# Patient Record
Sex: Female | Born: 1960 | Race: White | Hispanic: No | Marital: Married | Smoking: Never smoker
Health system: Southern US, Community
[De-identification: ages and names within clinical notes are randomized; demographics above are authoritative.]

## PROBLEM LIST (undated history)

## (undated) DIAGNOSIS — G620 Drug-induced polyneuropathy: Secondary | ICD-10-CM

## (undated) DIAGNOSIS — R112 Nausea with vomiting, unspecified: Secondary | ICD-10-CM

## (undated) DIAGNOSIS — C801 Malignant (primary) neoplasm, unspecified: Secondary | ICD-10-CM

## (undated) DIAGNOSIS — N200 Calculus of kidney: Secondary | ICD-10-CM

## (undated) DIAGNOSIS — Z9221 Personal history of antineoplastic chemotherapy: Secondary | ICD-10-CM

## (undated) DIAGNOSIS — Z9013 Acquired absence of bilateral breasts and nipples: Secondary | ICD-10-CM

## (undated) DIAGNOSIS — Z95828 Presence of other vascular implants and grafts: Secondary | ICD-10-CM

## (undated) DIAGNOSIS — Z9889 Other specified postprocedural states: Secondary | ICD-10-CM

## (undated) DIAGNOSIS — T451X5A Adverse effect of antineoplastic and immunosuppressive drugs, initial encounter: Secondary | ICD-10-CM

## (undated) DIAGNOSIS — E0590 Thyrotoxicosis, unspecified without thyrotoxic crisis or storm: Secondary | ICD-10-CM

## (undated) DIAGNOSIS — Z923 Personal history of irradiation: Secondary | ICD-10-CM

## (undated) DIAGNOSIS — Z8489 Family history of other specified conditions: Secondary | ICD-10-CM

## (undated) DIAGNOSIS — C50912 Malignant neoplasm of unspecified site of left female breast: Secondary | ICD-10-CM

## (undated) HISTORY — DX: Malignant (primary) neoplasm, unspecified: C80.1

## (undated) HISTORY — DX: Presence of other vascular implants and grafts: Z95.828

## (undated) HISTORY — PX: DOPPLER ECHOCARDIOGRAPHY: SHX263

---

## 2000-03-25 ENCOUNTER — Emergency Department (HOSPITAL_COMMUNITY): Admission: EM | Admit: 2000-03-25 | Discharge: 2000-03-25 | Payer: Self-pay | Admitting: Emergency Medicine

## 2000-03-25 ENCOUNTER — Encounter: Payer: Self-pay | Admitting: Emergency Medicine

## 2000-10-19 ENCOUNTER — Other Ambulatory Visit: Admission: RE | Admit: 2000-10-19 | Discharge: 2000-10-19 | Payer: Self-pay | Admitting: Obstetrics and Gynecology

## 2001-02-10 ENCOUNTER — Inpatient Hospital Stay (HOSPITAL_COMMUNITY): Admission: RE | Admit: 2001-02-10 | Discharge: 2001-02-12 | Payer: Self-pay | Admitting: Obstetrics and Gynecology

## 2002-09-23 ENCOUNTER — Inpatient Hospital Stay (HOSPITAL_COMMUNITY): Admission: RE | Admit: 2002-09-23 | Discharge: 2002-09-24 | Payer: Self-pay | Admitting: Obstetrics and Gynecology

## 2002-09-23 ENCOUNTER — Encounter: Payer: Self-pay | Admitting: Obstetrics and Gynecology

## 2002-09-27 ENCOUNTER — Ambulatory Visit (HOSPITAL_COMMUNITY): Admission: AD | Admit: 2002-09-27 | Discharge: 2002-09-27 | Payer: Self-pay | Admitting: Obstetrics and Gynecology

## 2002-09-28 ENCOUNTER — Ambulatory Visit (HOSPITAL_COMMUNITY): Admission: AD | Admit: 2002-09-28 | Discharge: 2002-09-28 | Payer: Self-pay | Admitting: Obstetrics and Gynecology

## 2002-10-01 ENCOUNTER — Ambulatory Visit (HOSPITAL_COMMUNITY): Admission: AD | Admit: 2002-10-01 | Discharge: 2002-10-01 | Payer: Self-pay | Admitting: Obstetrics and Gynecology

## 2002-10-03 ENCOUNTER — Inpatient Hospital Stay (HOSPITAL_COMMUNITY): Admission: AD | Admit: 2002-10-03 | Discharge: 2002-10-06 | Payer: Self-pay | Admitting: Obstetrics and Gynecology

## 2011-10-03 DIAGNOSIS — C50912 Malignant neoplasm of unspecified site of left female breast: Secondary | ICD-10-CM

## 2011-10-03 HISTORY — DX: Malignant neoplasm of unspecified site of left female breast: C50.912

## 2011-10-04 ENCOUNTER — Ambulatory Visit (INDEPENDENT_AMBULATORY_CARE_PROVIDER_SITE_OTHER): Payer: Medicaid Other | Admitting: *Deleted

## 2011-10-04 ENCOUNTER — Encounter: Payer: Self-pay | Admitting: *Deleted

## 2011-10-04 ENCOUNTER — Other Ambulatory Visit: Payer: Self-pay | Admitting: Obstetrics and Gynecology

## 2011-10-04 VITALS — BP 88/51 | HR 71 | Temp 97.9°F | Ht 67.0 in | Wt 152.5 lb

## 2011-10-04 DIAGNOSIS — N6320 Unspecified lump in the left breast, unspecified quadrant: Secondary | ICD-10-CM

## 2011-10-04 DIAGNOSIS — Z01419 Encounter for gynecological examination (general) (routine) without abnormal findings: Secondary | ICD-10-CM

## 2011-10-04 DIAGNOSIS — N63 Unspecified lump in unspecified breast: Secondary | ICD-10-CM

## 2011-10-04 NOTE — Patient Instructions (Signed)
Taught patient how to perform BSE. Let her know BCCCP will cover Pap smears every 3 years unless has a history of abnormal Pap smear. Patient referred to the [facility] for diagnostic mammogram and possible left breast ultrasound. Appointment scheduled for Friday, Oct 07, 2011 at 0900. Patient aware of appointment and will be there. Let patient know will follow up with her within the next couple weeks with results. Patient verbalized understanding.

## 2011-10-04 NOTE — Progress Notes (Addendum)
Patient complained of left breast pain, left breast lump, left breast nipple discharge and left breast nipple inversion  Pap Smear:    Pap smear completed today. Per patient last Pap smear was 10/03/02 and normal. Per patient no history of abnormal Pap smears. No Pap smear results in EPIC.  Physical exam: Breasts Breasts symmetrical. No skin abnormalities bilateral breasts. No nipple retraction right breast. Slight nipple retraction in left breast per patient is not normal for her and has been a recent change. No nipple discharge right breast. Per patient has spontaneous brownish colored left breast nipple discharge that has been occuring at times since 2007. No nipple discharge on exam. No lymphadenopathy. No lumps palpated right breast. Palpated lump in left breast at 1 o'clock next to areola that per patient has increased in size since 2007 when she first noticed. Patient complained of moderate pain that comes and goes in left breast that has occurred since 2007 and has been increasing. No complaints of pain or tenderness on exam. Patient referred to the [facility] for diagnostic mammogram and possible left breast ultrasound. Appointment scheduled for Friday, Oct 07, 2011 at 0900.         Pelvic/Bimanual   Ext Genitalia No lesions, no swelling and no discharge observed on external genitalia.         Vagina Vagina pink and normal texture. No lesions or discharge observed in vagina.          Cervix Cervix is present. Cervix pink and of normal texture. No discharge observed.     Uterus Uterus is present and palpable. Uterus in normal position and normal size.        Adnexae Bilateral ovaries present and palpable. No tenderness on palpation.          Rectovaginal No rectal exam completed today since patient had no rectal complaints. No skin abnormalities observed on exam.

## 2011-10-07 ENCOUNTER — Other Ambulatory Visit: Payer: Self-pay | Admitting: Obstetrics and Gynecology

## 2011-10-07 ENCOUNTER — Ambulatory Visit
Admission: RE | Admit: 2011-10-07 | Discharge: 2011-10-07 | Disposition: A | Discharge status: Home / Self Care | Payer: No Typology Code available for payment source | Source: Ambulatory Visit | Attending: Obstetrics and Gynecology | Admitting: Obstetrics and Gynecology

## 2011-10-07 DIAGNOSIS — N6320 Unspecified lump in the left breast, unspecified quadrant: Secondary | ICD-10-CM

## 2011-10-07 DIAGNOSIS — R921 Mammographic calcification found on diagnostic imaging of breast: Secondary | ICD-10-CM

## 2011-10-12 ENCOUNTER — Encounter: Payer: Self-pay | Admitting: Obstetrics and Gynecology

## 2011-10-13 ENCOUNTER — Other Ambulatory Visit: Payer: Self-pay | Admitting: Obstetrics and Gynecology

## 2011-10-13 ENCOUNTER — Ambulatory Visit
Admission: RE | Admit: 2011-10-13 | Discharge: 2011-10-13 | Disposition: A | Discharge status: Home / Self Care | Payer: Self-pay | Source: Ambulatory Visit | Attending: Obstetrics and Gynecology | Admitting: Obstetrics and Gynecology

## 2011-10-13 DIAGNOSIS — N6320 Unspecified lump in the left breast, unspecified quadrant: Secondary | ICD-10-CM

## 2011-10-13 DIAGNOSIS — R921 Mammographic calcification found on diagnostic imaging of breast: Secondary | ICD-10-CM

## 2011-10-14 ENCOUNTER — Other Ambulatory Visit: Payer: Self-pay | Admitting: Obstetrics and Gynecology

## 2011-10-14 ENCOUNTER — Ambulatory Visit
Admission: RE | Admit: 2011-10-14 | Discharge: 2011-10-14 | Disposition: A | Discharge status: Home / Self Care | Payer: Self-pay | Source: Ambulatory Visit | Attending: Obstetrics and Gynecology | Admitting: Obstetrics and Gynecology

## 2011-10-14 DIAGNOSIS — N6320 Unspecified lump in the left breast, unspecified quadrant: Secondary | ICD-10-CM

## 2011-10-14 DIAGNOSIS — C50912 Malignant neoplasm of unspecified site of left female breast: Secondary | ICD-10-CM

## 2011-10-19 ENCOUNTER — Telehealth: Payer: Self-pay | Admitting: *Deleted

## 2011-10-19 NOTE — Telephone Encounter (Signed)
Attempted to call patient to set up a time to complete her BCCCP Medicaid paperwork. No one answered phone. Left message for patient to call me back.

## 2011-10-21 ENCOUNTER — Ambulatory Visit
Admission: RE | Admit: 2011-10-21 | Discharge: 2011-10-21 | Disposition: A | Discharge status: Home / Self Care | Payer: Self-pay | Source: Ambulatory Visit | Attending: Obstetrics and Gynecology | Admitting: Obstetrics and Gynecology

## 2011-10-21 ENCOUNTER — Other Ambulatory Visit: Payer: Self-pay

## 2011-10-21 DIAGNOSIS — C50912 Malignant neoplasm of unspecified site of left female breast: Secondary | ICD-10-CM

## 2011-10-21 MED ORDER — GADOBENATE DIMEGLUMINE 529 MG/ML IV SOLN
13.0000 mL | Freq: Once | INTRAVENOUS | Status: AC | PRN
  Administered 2011-10-21: 13 mL via INTRAVENOUS

## 2011-10-25 ENCOUNTER — Other Ambulatory Visit: Payer: Self-pay | Admitting: Obstetrics and Gynecology

## 2011-10-25 DIAGNOSIS — R928 Other abnormal and inconclusive findings on diagnostic imaging of breast: Secondary | ICD-10-CM

## 2011-10-26 ENCOUNTER — Telehealth: Payer: Self-pay | Admitting: *Deleted

## 2011-10-26 ENCOUNTER — Encounter: Payer: Self-pay | Admitting: *Deleted

## 2011-10-26 ENCOUNTER — Ambulatory Visit (INDEPENDENT_AMBULATORY_CARE_PROVIDER_SITE_OTHER): Payer: Medicaid Other | Admitting: General Surgery

## 2011-10-26 ENCOUNTER — Encounter (INDEPENDENT_AMBULATORY_CARE_PROVIDER_SITE_OTHER): Payer: Self-pay | Admitting: General Surgery

## 2011-10-26 ENCOUNTER — Ambulatory Visit
Admission: RE | Admit: 2011-10-26 | Discharge: 2011-10-26 | Disposition: A | Discharge status: Home / Self Care | Payer: No Typology Code available for payment source | Source: Ambulatory Visit | Attending: Obstetrics and Gynecology | Admitting: Obstetrics and Gynecology

## 2011-10-26 VITALS — BP 114/72 | HR 70 | Temp 96.9°F | Resp 16 | Ht 67.0 in | Wt 155.0 lb

## 2011-10-26 DIAGNOSIS — R928 Other abnormal and inconclusive findings on diagnostic imaging of breast: Secondary | ICD-10-CM

## 2011-10-26 DIAGNOSIS — C50919 Malignant neoplasm of unspecified site of unspecified female breast: Secondary | ICD-10-CM

## 2011-10-26 NOTE — Progress Notes (Signed)
Called Cathy at [facility] and gave her Dr. Renelda Loma appt info.  She will give to pt.  Mailed before letter & packet to pt.  Emailed Bernie at [facility] to make her aware.

## 2011-10-26 NOTE — Telephone Encounter (Signed)
Attempted to call patient to complete BCCCP Medicaid paperwork. No one answered phone. Left message for patient to call back. Will send patient certified letter since second attempt to call patient and have not been able to reach patient.

## 2011-10-26 NOTE — Patient Instructions (Signed)
We will arrange for you to have an appointment with the medical oncology specialist at [facility].  Please read the sections on mastectomy and lymph node biopsy.

## 2011-10-26 NOTE — Progress Notes (Signed)
Patient ID: Bethany Michael, female   DOB: 09/10/1960, 51 y.o.   MRN: [id number]  Chief Complaint  Patient presents with  . Breast Cancer    HPI Bethany Michael is a 51 y.o. female.   HPI  She is referred by Dr. Daniel Boyle at the [facility] [place] for evaluation of a newly diagnosed left breast cancer.  She noticed a small left breast mass at least 5 years ago and presented to her PCP because of that.  A plan of action was suggested but she did not go through with it because her husband was diagnosed with prostate cancer at the time.  The mass has been getting larger and show noticed some transient left arm swelling.  The arm swelling has resolved but it prompted her to seek medical attention for the mass.  She underwent a mammogram and US which demonstrated a 3.8 cm left breast mass.  There were also some areas of concern in the right breast.  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  Proliferation rate is 67%.  MRI demonstrated the left breast masses as well as worrisome areas in  The right breast.  She has an appointment at 1:40 pm today for repeat US and possible biopsy of the suspicious areas in the right breast.  There is no family history of breast cancer.  Age at menarche was 18.  Age at first childbirth was 38.  She is still having menstrual periods.  The mass is not painful.  Past Medical History  Diagnosis Date  . Cancer     breast    Past Surgical History  Procedure Date  . Cesarean section     3 previous    Family History  Problem Relation Age of Onset  . Diabetes Mother     Social History History  Substance Use Topics  . Smoking status: Never Smoker   . Smokeless tobacco: Never Used  . Alcohol Use: Yes     occassionally    No Known Allergies  No current outpatient prescriptions on file.    Review of Systems Review of Systems  Constitutional: Negative.   HENT: Negative.     Respiratory: Negative.   Cardiovascular: Negative.   Gastrointestinal: Negative.   Genitourinary: Negative.   Musculoskeletal: Negative.   Neurological: Negative.   Hematological: Negative.     Blood pressure 114/72, pulse 70, temperature 96.9 F (36.1 C), temperature source Temporal, resp. rate 16, height 5' 7" (1.702 m), weight 155 lb (70.308 kg), last menstrual period 09/21/2011.  Physical Exam Physical Exam  Constitutional: She appears well-developed and well-nourished. No distress.  HENT:  Head: Normocephalic and atraumatic.  Eyes: Pupils are equal, round, and reactive to light. No scleral icterus.  Neck: Normal range of motion.  Cardiovascular: Normal rate and regular rhythm.   Pulmonary/Chest: Effort normal and breath sounds normal.       4-5 cm left breast mass at the 1:00 position nipple-areolar complex with an overlying skin rash; right breast demonstrateds irregularity in the superior aspect of the breast , but no dominant mass  Abdominal: Soft. She exhibits no distension and no mass. There is no tenderness.  Musculoskeletal: Normal range of motion. She exhibits no edema.       No axillary or supraclavicular adenopathy.  The arm swelling.  Lymphadenopathy:    She has no cervical adenopathy.  Neurological: She is alert.  Skin: Skin is warm and   dry.  Psychiatric: She has a normal mood and affect. Her behavior is normal.    Data Reviewed Imaging studies, pathology report  Assessment    Invasive left breast cancer, triple negative.  Also, has suspicious lesions in the right breast.  She told me that she has done some research on breast cancer and wants to have bilateral mastectomies.    Plan    Keep appointment with Breast Center today.  Referral to Medical Oncology.  If after these appointments, she still wants to proceed with bilateral mastectomies rather than neoadjuvant chemotherapy, we will proceed with this as well as axillary sentinel lymph node biopsy.  I  have explained the procedure, risks, and aftercare to her.  Risks include but are not limited to bleeding, infection, wound problems, anesthesia, chronic chest wall pain, nerve injury, seroma formation, lymphedema.  She seems to understand all of this.       Kenyata Guess J 10/26/2011, 2:03 PM    

## 2011-10-27 ENCOUNTER — Other Ambulatory Visit: Payer: Self-pay | Admitting: Obstetrics and Gynecology

## 2011-10-27 DIAGNOSIS — R928 Other abnormal and inconclusive findings on diagnostic imaging of breast: Secondary | ICD-10-CM

## 2011-10-29 DIAGNOSIS — Z95828 Presence of other vascular implants and grafts: Secondary | ICD-10-CM

## 2011-10-29 HISTORY — DX: Presence of other vascular implants and grafts: Z95.828

## 2011-10-31 ENCOUNTER — Other Ambulatory Visit: Payer: Self-pay | Admitting: Oncology

## 2011-10-31 DIAGNOSIS — C50919 Malignant neoplasm of unspecified site of unspecified female breast: Secondary | ICD-10-CM

## 2011-11-01 ENCOUNTER — Encounter: Payer: Self-pay | Admitting: Oncology

## 2011-11-01 ENCOUNTER — Ambulatory Visit: Payer: No Typology Code available for payment source

## 2011-11-01 ENCOUNTER — Other Ambulatory Visit (HOSPITAL_BASED_OUTPATIENT_CLINIC_OR_DEPARTMENT_OTHER): Payer: No Typology Code available for payment source | Admitting: Lab

## 2011-11-01 ENCOUNTER — Other Ambulatory Visit (INDEPENDENT_AMBULATORY_CARE_PROVIDER_SITE_OTHER): Payer: Self-pay | Admitting: General Surgery

## 2011-11-01 ENCOUNTER — Ambulatory Visit (HOSPITAL_BASED_OUTPATIENT_CLINIC_OR_DEPARTMENT_OTHER): Payer: No Typology Code available for payment source | Admitting: Oncology

## 2011-11-01 ENCOUNTER — Telehealth: Payer: Self-pay | Admitting: *Deleted

## 2011-11-01 VITALS — BP 112/63 | HR 62 | Temp 98.3°F | Ht 67.0 in | Wt 156.9 lb

## 2011-11-01 DIAGNOSIS — C50919 Malignant neoplasm of unspecified site of unspecified female breast: Secondary | ICD-10-CM

## 2011-11-01 DIAGNOSIS — Z171 Estrogen receptor negative status [ER-]: Secondary | ICD-10-CM

## 2011-11-01 LAB — COMPREHENSIVE METABOLIC PANEL
AST: 12 U/L (ref 0–37)
Alkaline Phosphatase: 58 U/L (ref 39–117)
BUN: 15 mg/dL (ref 6–23)
Calcium: 9.2 mg/dL (ref 8.4–10.5)
Creatinine, Ser: 0.81 mg/dL (ref 0.50–1.10)

## 2011-11-01 LAB — CBC WITH DIFFERENTIAL/PLATELET
Basophils Absolute: 0.1 10*3/uL (ref 0.0–0.1)
Eosinophils Absolute: 0.1 10*3/uL (ref 0.0–0.5)
HGB: 13.2 g/dL (ref 11.6–15.9)
MCV: 94.8 fL (ref 79.5–101.0)
MONO#: 0.4 10*3/uL (ref 0.1–0.9)
MONO%: 9.3 % (ref 0.0–14.0)
NEUT#: 2.8 10*3/uL (ref 1.5–6.5)
Platelets: 197 10*3/uL (ref 145–400)
RDW: 13.9 % (ref 11.2–14.5)

## 2011-11-01 NOTE — Progress Notes (Signed)
Referral MD  Reason for Referral: Locally advanced breast cancer    No chief complaint on file. : Breast pain   HPI: This is a 51 year old from  [place] who presents with locally advanced breast cancer affecting the left breast. She initially noted the presence of this mass about 5 years ago. At that time her husband been diagnosed with prostate cancer and she essentially elected to ignore it Over the past few months she began to experience pain in her left breast and so she does decide to go ahead for have this investigated.  A mammogram performed 10/07/2011 showed a suspicious mass in the upper-outer quadrant of the left breast measuring about 4 cm. There is also some indeterminate calcifications in the right breast. A biopsy performed 10/13/2011 showed this to be a ER and PR negative breast cancer, proliferative index 50%, HER-2 was negative. An MRI was performed and results are detailed below. Findings: Intense background parenchymal enhancement is seen  bilaterally. An irregular enhancing mass with spiculated margins  is seen in the retroareolar region of the left breast spanning  zones A-C with associated nipple retraction and elevation of the  pectoralis muscle measuring 4.9 x 4.9 x 3.9 cm. Skin thickening and  enhancement is noted along the lateral aspect of the left breast  suspicious for dermal invasion. No other mass or suspicious  enhancement seen in the left breast. No axillary or internal  mammary adenopathy is present.  In the retroareolar region of the right breast, middle third, there  is an enhancing irregular mass with spiculated margins and  predominantly plateau kinetics measuring 1.7 x 1.4 x 1.7 cm which  is suspicious for malignancy. No other suspicious mass or  enhancement is seen in the right breast. There is no axillary or  internal mammary adenopathy.  IMPRESSION:  1. Known malignancy, left breast detailed above.  2. Suspicious mass,  retroareolar region of the right breast  detailed above for which second look ultrasound is recommended. If  this is not seen sonographically, an MRI guided biopsy is  recommended.  Past Medical History  Diagnosis Date  . Cancer     breast  :  Past Surgical History  Procedure Date  . Cesarean section     3 previous  :  No current outpatient prescriptions on file.:    :  No Known Allergies:  Family History  Problem Relation Age of Onset  . Diabetes Mother   :  History   Social History  . Marital Status:  married for 20 years.     Spouse Name: N/A    Number of Children:  3 ages 63 and 81   . Years of Education: N/A   Occupational History  . Not on file.works part-time at [employer], husband who has metastatic prostate cancer is on disability    Social History Main Topics  . Smoking status: Never Smoker   . Smokeless tobacco: Never Used  . Alcohol Use: Yes     occassionally  . Drug Use: No  . Sexually Active: Not Currently   Other Topics Concern  . Not on file   Social History Narrative  . No narrative on file  :    A comprehensive review of systems was negative.  Exam:   General appearance: alert, cooperative and appears stated age Eyes: conjunctivae/corneas clear. PERRL, EOM's intact. Fundi benign. Throat: lips, mucosa, and tongue normal; teeth and gums normal Resp: clear to auscultation bilaterally and normal percussion bilaterally  Breasts: Right breast essentially normal, there is some dense fibrocystic tissue. The left breast has a visible mass in the upper outer quadrant of the breast. Because of dense fibrous tissue is difficult to actually determine size of the mass of the upper outer quadrant has a rash over the surface of the breast which may be an allergic-type reaction versus skin involvement. GI: soft, non-tender; bowel sounds normal; no masses,  no organomegaly Extremities: extremities normal, atraumatic, no cyanosis or  edema Pulses: 2+ and symmetric Lymph nodes: Cervical, supraclavicular, and axillary nodes normal. Neurologic: Grossly normal   Basename 11/01/11 0924  WBC 4.7  HGB 13.2  HCT 39.6  PLT 197   No results found for this basename: NA:2,K:2,CL:2,CO2:2,GLUCOSE:2,BUN:2,CREATININE:2,CALCIUM:2 in the last 72 hours  Blood smear review: n/a  Pathology:as above  US Breast Right  10/26/2011  *RADIOLOGY REPORT*  Clinical Data:  51 year old female with newly diagnosed left breast carcinoma. Abnormal breast MRI demonstrating a 1.7 cm right retroareolar mass - for second look ultrasound.  RIGHT BREAST ULTRASOUND  Comparison:  10/07/2011 mammograms.  On physical exam, mild thickening throughout the right breast is noted.  Findings: Ultrasound is performed, showing extremely dense fibroglandular tissue throughout the right breast with scattered areas of shadowing.  A discrete mass is difficult to identify which would correspond to the MR finding.  IMPRESSION: Right breast MR abnormality/mass not identified sonographically. MR guided right breast biopsy is recommended.  This finding was discussed with the patient and her questions answered.  BI-RADS CATEGORY 1:  Negative.  Recommend MR guided biopsy of the right breast mass identified on recent MRI. Our office will schedule this appointment and contact the patient.  Original Report Authenticated By: Rosendo Gros, M.D.   US Breast Bilateral  10/07/2011  *RADIOLOGY REPORT*  Clinical Data:  Palpable lump in the left breast associated with nipple retraction.  Initial mammogram.  DIGITAL DIAGNOSTIC BILATERAL MAMMOGRAM WITH CAD AND BILATERAL BREAST ULTRASOUND:  Comparison:  None.  Findings:  CC and MLO views of both breasts and spot magnification views of the right breast were obtained.  Heterogeneously dense fibroglandular parenchymal pattern.  Approximate 4 cm spiculated mass with associated microcalcifications in the upper and slightly outer left breast, associated  with skin thickening and nipple retraction.  Microcalcifications scattered throughout the right breast, more clustered in the upper outer quadrant.  The spot magnification views of the upper outer right breast show that these calcifications are pleomorphic and in a linear/segmental orientation. Mammographic images were processed with CAD.  On physical exam, I palpate a firm, non-mobile mass in the 12 o'clock position of the right breast approximately 3-4 cm from the nipple, extending over an approximate 3-4 cm length.  Palpable firm tissue is present in the upper outer right breast, though a discrete mass was not palpated.  Ultrasound is performed, showing an irregular, spiculated, hypoechoic mass with associated microcalcifications and acoustic shadowing in the 12 o'clock position of the left breast, beginning approximately 3 cm from the nipple, with approximate measurements of 3.8 x 1.0 x 1.6 cm.  Evaluation of the left axilla revealed no pathologic lymphadenopathy.  No solid mass or abnormal acoustic shadowing was identified in the upper outer quadrant of the right breast; at the 11 o'clock position, 10 cm from the nipple, is an area of focal fibrocystic change.  IMPRESSION:  1.  Approximate 4 cm spiculated mass (3.8 cm by ultrasound) associated with microcalcifications in the upper and slightly outer left breast, consistent with an invasive cancer. 2.  Indeterminate but suspicious microcalcifications in the upper outer quadrant of the right breast.  Microcalcifications are present throughout the right breast.  Recommendation:  Ultrasound guided core needle biopsy of the left breast mass and stereotactic core needle biopsy of the calcifications in the upper outer quadrant of the right breast is recommended.  This was discussed with the patient who agrees to proceed.  The biopsies have been scheduled for May 16 at 8:30 a.m.  BI-RADS CATEGORY 5:  Highly suggestive of malignancy - appropriate action should be taken.   Original Report Authenticated By: Arnell Sieving, M.D.   [place] Core Biopsy  10/13/2011  **ADDENDUM** CREATED: 10/13/2011 10:33:14  Note that the a stereotactic right breast biopsy was attempted, however of the widespread scattered microcalcifications of the upper outer right breast were somewhat difficult to localize.  The group identified on patient's recent diagnostic exam were very close to the skin and technically to difficult to get to stereotactically. It was discussed with the patient that these microcalcifications would have to be biopsied via needle localization with surgical excision, although there was a good chance that these were benign in nature.  It was discussed that there was a high likelihood that the left breast biopsy would be positive for malignancy and that the patient would require a breast MRI.  If there was a suspicious area in the right upper quadrant on the breast MRI, we could pursue biopsy of these microcalcifications, however biopsy was deferred at this time.  **END ADDENDUM** SIGNED BY: Melton Alar. Micheline Maze, M.D.   10/13/2011  *RADIOLOGY REPORT*  Clinical Data:  Patient presents for ultrasound-guided core biopsy of a 4 cm spiculated mass with microcalcifications over the 12 o'clock position of the left breast.  ULTRASOUND GUIDED CORE BIOPSY OF THE left BREAST  Comparison: 10/07/2011  I met with the patient and we discussed the procedure of ultrasound- guided biopsy, including benefits and alternatives.  We discussed the high likelihood of a successful procedure. We discussed the risks of the procedure, including infection, bleeding, tissue injury, clip migration, and inadequate sampling.  Informed written consent was given.  Using sterile technique 4 ml lidocaine, ultrasound guidance and a 14 gauge automated biopsy device, biopsy was performed of the targeted mass with microcalcifications at the 12 o'clock position. At the conclusion of the procedure a ribbon shaped tissue marker clip  was deployed into the biopsy cavity.  Follow up 2 view mammogram was performed and dictated separately.  IMPRESSION: Ultrasound guided biopsy of a spiculated 4 cm mass with microcalcifications at the 12 o'clock position of the left breast. No apparent complications. Original Report Authenticated By: Elba Barman, M.D.   Mm Digital Diagnostic Bilat  10/07/2011  *RADIOLOGY REPORT*  Clinical Data:  Palpable lump in the left breast associated with nipple retraction.  Initial mammogram.  DIGITAL DIAGNOSTIC BILATERAL MAMMOGRAM WITH CAD AND BILATERAL BREAST ULTRASOUND:  Comparison:  None.  Findings:  CC and MLO views of both breasts and spot magnification views of the right breast were obtained.  Heterogeneously dense fibroglandular parenchymal pattern.  Approximate 4 cm spiculated mass with associated microcalcifications in the upper and slightly outer left breast, associated with skin thickening and nipple retraction.  Microcalcifications scattered throughout the right breast, more clustered in the upper outer quadrant.  The spot magnification views of the upper outer right breast show that these calcifications are pleomorphic and in a linear/segmental orientation. Mammographic images were processed with CAD.  On physical exam, I palpate a firm, non-mobile mass in  the 12 o'clock position of the right breast approximately 3-4 cm from the nipple, extending over an approximate 3-4 cm length.  Palpable firm tissue is present in the upper outer right breast, though a discrete mass was not palpated.  Ultrasound is performed, showing an irregular, spiculated, hypoechoic mass with associated microcalcifications and acoustic shadowing in the 12 o'clock position of the left breast, beginning approximately 3 cm from the nipple, with approximate measurements of 3.8 x 1.0 x 1.6 cm.  Evaluation of the left axilla revealed no pathologic lymphadenopathy.  No solid mass or abnormal acoustic shadowing was identified in the upper  outer quadrant of the right breast; at the 11 o'clock position, 10 cm from the nipple, is an area of focal fibrocystic change.  IMPRESSION:  1.  Approximate 4 cm spiculated mass (3.8 cm by ultrasound) associated with microcalcifications in the upper and slightly outer left breast, consistent with an invasive cancer. 2.  Indeterminate but suspicious microcalcifications in the upper outer quadrant of the right breast.  Microcalcifications are present throughout the right breast.  Recommendation:  Ultrasound guided core needle biopsy of the left breast mass and stereotactic core needle biopsy of the calcifications in the upper outer quadrant of the right breast is recommended.  This was discussed with the patient who agrees to proceed.  The biopsies have been scheduled for May 16 at 8:30 a.m.  BI-RADS CATEGORY 5:  Highly suggestive of malignancy - appropriate action should be taken.  Original Report Authenticated By: Arnell Sieving, M.D.   Mm Digital Diagnostic Unilat L  10/13/2011  *RADIOLOGY REPORT*  Clinical Data: The patient is post ultrasound guided core needle biopsy of a 4 cm spiculated mass with microcalcifications at the 12 o'clock position of the left breast.  ULTRASOUND GUIDED POST-BIOPSY CLIP PLACEMENT LEFT  Comparison:  10/07/2011  Findings: Exam demonstrates satisfactory placement of a ribbon shaped clip over patient's biopsied mass at the 12 o'clock position.  IMPRESSION: Satisfactory clip placement post ultrasound guided core biopsy left breast mass.  Original Report Authenticated By: Elba Barman, M.D.   Mm Radiologist Eval And Mgmt  10/14/2011  *RADIOLOGY REPORT*  ESTABLISHED PATIENT OFFICE VISIT - LEVEL II [id number])  Chief Complaint:  Post left breast biopsy  History:  Patient underwent ultrasound-guided core biopsy of an irregular 3.8 cm mass at the 12 o'clock position of the left breast.  Exam:  The biopsy site is clean and dry without signs of hematoma. There is minor skin irritation  adjacent the biopsy site as the patient states she had removed the Steri-Strips in the shower.  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  This is concordant with the imaging findings.  The patient's immediate questions and concerns were addressed.  The patient was given a follow-up surgical appointment with Dr. Abbey Chatters 10/26/2011 at 10:45 a.m. and an MRI appointment at [facility] 10/21/2011 at 10:15 a.m. The patient was given educational materials concerning breast cancer.  Will await results of patient's MRI to decide if there is a need for biopsy of right breast microcalcifications.  Original Report Authenticated By: Elba Barman, M.D.    Assessment and Plan:  51 year old woman with locally advanced breast cancer dating back at least 5 years. This is a triple negative breast cancer. With a fairly lengthy discussion regarding the management of this. She is concerned about the commitment to treatment given her other responsibilities both to her  children job and husband. I explained that even she had a mastectomy that she would likely need to have some form of adjuvant chemotherapy and likely radiation. I think that she would best be served at to be treated in [place]. I think a baseline MRI scan of the pectoralis involvement and as well as skin involvement. The locally advanced nature this cancer would require some form of downstaging chemotherapy to please make a mastectomy more feasible. She also had a biopsy of the contralateral breast.  Essentially she will need to have staging scans which can take place locally I will see her next week when I am in her local hospital and will make arrangements for her to be seen in followup by Dr. Mariel Sleet  in at [facility]. I would recommend that she receive dose dense FEC followed by dose dense Taxotere for total of 8 cycles, 4 of each.  Pierce Crane  M.D. FRCP C. 11/01/2011

## 2011-11-01 NOTE — Progress Notes (Signed)
Patient came in today as a new patient,she has been seen at the [facility] with Fonnie Mu.I gave her a Medicaid application and [place] application to fill out and return to me and then I will get with Wynona Canes on her medicaid application.

## 2011-11-01 NOTE — Telephone Encounter (Signed)
gave patient appointment for 11-08-2011 chemo edu class 11-08-2011 echo at [place] 11-09-2011 for pet scan arrival time 9:15am printed out calendar and gave to the patient called left voice to inform dr.rosenbower that dr.rubin would like to get the port a cath placed

## 2011-11-04 ENCOUNTER — Encounter (INDEPENDENT_AMBULATORY_CARE_PROVIDER_SITE_OTHER): Payer: Self-pay | Admitting: General Surgery

## 2011-11-04 NOTE — Progress Notes (Signed)
Patient ID: Bethany Michael, female   DOB: 1961-01-04, 51 y.o.   MRN: [id number] I was able to contact her yesterday and talk about the Port-A-Cath insertion which she is agreeable to. The plan is for neoadjuvant chemotherapy.  We will work on scheduling an operative date for her.  The procedure risks and aftercare been explained. Risks include but are not limited to bleeding, infection, malfunction, pneumothorax, wound problems, DVT.

## 2011-11-07 ENCOUNTER — Other Ambulatory Visit (HOSPITAL_COMMUNITY): Payer: Self-pay | Admitting: Oncology

## 2011-11-07 ENCOUNTER — Ambulatory Visit
Admission: RE | Admit: 2011-11-07 | Discharge: 2011-11-07 | Disposition: A | Discharge status: Home / Self Care | Payer: No Typology Code available for payment source | Source: Ambulatory Visit | Attending: Obstetrics and Gynecology | Admitting: Obstetrics and Gynecology

## 2011-11-07 ENCOUNTER — Telehealth: Payer: Self-pay | Admitting: *Deleted

## 2011-11-07 ENCOUNTER — Other Ambulatory Visit (HOSPITAL_COMMUNITY): Payer: Self-pay

## 2011-11-07 ENCOUNTER — Encounter (HOSPITAL_COMMUNITY): Payer: Self-pay

## 2011-11-07 ENCOUNTER — Encounter (HOSPITAL_COMMUNITY): Payer: Medicaid Other | Attending: Oncology

## 2011-11-07 VITALS — BP 107/66 | HR 60 | Temp 97.7°F | Wt 154.4 lb

## 2011-11-07 DIAGNOSIS — R928 Other abnormal and inconclusive findings on diagnostic imaging of breast: Secondary | ICD-10-CM

## 2011-11-07 DIAGNOSIS — C50919 Malignant neoplasm of unspecified site of unspecified female breast: Secondary | ICD-10-CM | POA: Insufficient documentation

## 2011-11-07 DIAGNOSIS — Z171 Estrogen receptor negative status [ER-]: Secondary | ICD-10-CM

## 2011-11-07 MED ORDER — GADOBENATE DIMEGLUMINE 529 MG/ML IV SOLN
13.0000 mL | Freq: Once | INTRAVENOUS | Status: AC | PRN
  Administered 2011-11-07: 13 mL via INTRAVENOUS

## 2011-11-07 MED ORDER — PROCHLORPERAZINE 25 MG RE SUPP: 25.0000 mg | Freq: Two times a day (BID) | RECTAL | Status: DC | PRN

## 2011-11-07 MED ORDER — LORAZEPAM 0.5 MG PO TABS: 0.5000 mg | ORAL_TABLET | Freq: Four times a day (QID) | ORAL | Status: DC | PRN

## 2011-11-07 MED ORDER — LIDOCAINE-PRILOCAINE 2.5-2.5 % EX CREA
TOPICAL_CREAM | Freq: Once | CUTANEOUS | Status: DC
  Filled 2011-11-07: qty 5

## 2011-11-07 MED ORDER — ONDANSETRON HCL 8 MG PO TABS: ORAL_TABLET | ORAL | Status: DC

## 2011-11-07 MED ORDER — DEXAMETHASONE 4 MG PO TABS: ORAL_TABLET | ORAL | Status: DC

## 2011-11-07 MED ORDER — PROCHLORPERAZINE MALEATE 10 MG PO TABS: 10.0000 mg | ORAL_TABLET | Freq: Four times a day (QID) | ORAL | Status: DC | PRN

## 2011-11-07 NOTE — Progress Notes (Signed)
Hematology and Oncology Follow Up Visit  Bethany Michael [id number] 19-Mar-1961 51 y.o. 11/07/2011 3:35 PM   DIAGNOSIS: 51 yo [place] woman with  locally advanced breast cancer, [place] , due to start neoadjuvant therapy  No diagnosis found.   PAST THERAPY: none   Interim History:  She had a MRI guided biopsy today of her rt breast, she is still awaiting scheduling of her 2 d echo and PET scan  Medications: I have reviewed the patient's current medications.  Allergies: No Known Allergies  Past Medical History, Surgical history, Social history, and Family History were reviewed and updated.  Review of Systems: Constitutional:  Negative for fever, chills, night sweats, anorexia, weight loss, pain. Cardiovascular: negative Respiratory: negative Neurological: negative Dermatological: negative ENT: negative Skin Gastrointestinal: negative Genito-Urinary: negative Hematological and Lymphatic: negative Breast: negative Musculoskeletal: negative Remaining ROS negative.  Physical Exam:  Blood pressure 107/66, pulse 60, temperature 97.7 F (36.5 C), temperature source Oral, weight 154 lb 6.4 oz (70.035 kg).  ECOG: 0     Lab Results: Lab Results  Component Value Date   WBC 4.7 11/01/2011   HGB 13.2 11/01/2011   HCT 39.6 11/01/2011   MCV 94.8 11/01/2011   PLT 197 11/01/2011     Chemistry      Component Value Date/Time   NA 139 11/01/2011 0924   K 4.3 11/01/2011 0924   CL 106 11/01/2011 0924   CO2 24 11/01/2011 0924   BUN 15 11/01/2011 0924   CREATININE 0.81 11/01/2011 0924      Component Value Date/Time   CALCIUM 9.2 11/01/2011 0924   ALKPHOS 58 11/01/2011 0924   AST 12 11/01/2011 0924   ALT 10 11/01/2011 0924   BILITOT 0.5 11/01/2011 0924       Radiological Studies:  US Breast Right  10/26/2011  *RADIOLOGY REPORT*  Clinical Data:  51 year old female with newly diagnosed left breast carcinoma. Abnormal breast MRI demonstrating a 1.7 cm right retroareolar mass - for second look  ultrasound.  RIGHT BREAST ULTRASOUND  Comparison:  10/07/2011 mammograms.  On physical exam, mild thickening throughout the right breast is noted.  Findings: Ultrasound is performed, showing extremely dense fibroglandular tissue throughout the right breast with scattered areas of shadowing.  A discrete mass is difficult to identify which would correspond to the MR finding.  IMPRESSION: Right breast MR abnormality/mass not identified sonographically. MR guided right breast biopsy is recommended.  This finding was discussed with the patient and her questions answered.  BI-RADS CATEGORY 1:  Negative.  Recommend MR guided biopsy of the right breast mass identified on recent MRI. Our office will schedule this appointment and contact the patient.  Original Report Authenticated By: Rosendo Gros, M.D.   Mr Breast Bilateral W Wo Contrast  10/21/2011  *RADIOLOGY REPORT*  Clinical Data: New diagnosis left sided breast cancer.  Suspicious right breast microcalcifications which were unable to be biopsied stereotactically.  BILATERAL BREAST MRI WITH AND WITHOUT CONTRAST  Technique: Multiplanar, multisequence MR images of both breasts were obtained prior to and following the intravenous administration of 13ml of Multihance.  Three dimensional images were evaluated at the independent DynaCad workstation.  Comparison:  Mammogram dated 10/07/2011  Findings: Intense background parenchymal enhancement is seen bilaterally.  An irregular enhancing mass with spiculated margins is seen in the retroareolar region of the left breast spanning zones A-C with associated nipple retraction and elevation of the pectoralis muscle measuring 4.9 x 4.9 x 3.9 cm. Skin thickening and enhancement is noted along the  lateral aspect of the left breast suspicious for dermal invasion.  No other mass or suspicious enhancement seen in the left breast.  No axillary or internal mammary adenopathy is present.  In the retroareolar region of the right breast,  middle third, there is an enhancing irregular mass with spiculated margins and predominantly plateau kinetics measuring 1.7 x 1.4 x 1.7 cm which is suspicious for malignancy. No other suspicious mass or enhancement is seen in the right breast.  There is no axillary or internal mammary adenopathy.  IMPRESSION: 1.  Known malignancy, left breast detailed above. 2.  Suspicious mass, retroareolar region of the right breast detailed above for which second look ultrasound is recommended.  If this is not seen sonographically, an MRI guided biopsy is recommended.  THREE-DIMENSIONAL MR IMAGE RENDERING ON INDEPENDENT WORKSTATION:  Three-dimensional MR images were rendered by post-processing of the original MR data on an independent workstation.  The three- dimensional MR images were interpreted, and findings were reported in the accompanying complete MRI report for this study.  BI-RADS CATEGORY 6:  Known biopsy-proven malignancy - appropriate action should be taken.  Original Report Authenticated By: Hiram Gash, M.D.   [place] Core Biopsy  10/13/2011  **ADDENDUM** CREATED: 10/13/2011 10:33:14  Note that the a stereotactic right breast biopsy was attempted, however of the widespread scattered microcalcifications of the upper outer right breast were somewhat difficult to localize.  The group identified on patient's recent diagnostic exam were very close to the skin and technically to difficult to get to stereotactically. It was discussed with the patient that these microcalcifications would have to be biopsied via needle localization with surgical excision, although there was a good chance that these were benign in nature.  It was discussed that there was a high likelihood that the left breast biopsy would be positive for malignancy and that the patient would require a breast MRI.  If there was a suspicious area in the right upper quadrant on the breast MRI, we could pursue biopsy of these microcalcifications, however  biopsy was deferred at this time.  **END ADDENDUM** SIGNED BY: Melton Alar. Micheline Maze, M.D.   10/13/2011  *RADIOLOGY REPORT*  Clinical Data:  Patient presents for ultrasound-guided core biopsy of a 4 cm spiculated mass with microcalcifications over the 12 o'clock position of the left breast.  ULTRASOUND GUIDED CORE BIOPSY OF THE left BREAST  Comparison: 10/07/2011  I met with the patient and we discussed the procedure of ultrasound- guided biopsy, including benefits and alternatives.  We discussed the high likelihood of a successful procedure. We discussed the risks of the procedure, including infection, bleeding, tissue injury, clip migration, and inadequate sampling.  Informed written consent was given.  Using sterile technique 4 ml lidocaine, ultrasound guidance and a 14 gauge automated biopsy device, biopsy was performed of the targeted mass with microcalcifications at the 12 o'clock position. At the conclusion of the procedure a ribbon shaped tissue marker clip was deployed into the biopsy cavity.  Follow up 2 view mammogram was performed and dictated separately.  IMPRESSION: Ultrasound guided biopsy of a spiculated 4 cm mass with microcalcifications at the 12 o'clock position of the left breast. No apparent complications. Original Report Authenticated By: Elba Barman, M.D.   Mm Digital Diagnostic Unilat L  10/13/2011  *RADIOLOGY REPORT*  Clinical Data: The patient is post ultrasound guided core needle biopsy of a 4 cm spiculated mass with microcalcifications at the 12 o'clock position of the left breast.  ULTRASOUND GUIDED POST-BIOPSY CLIP PLACEMENT  LEFT  Comparison:  10/07/2011  Findings: Exam demonstrates satisfactory placement of a ribbon shaped clip over patient's biopsied mass at the 12 o'clock position.  IMPRESSION: Satisfactory clip placement post ultrasound guided core biopsy left breast mass.  Original Report Authenticated By: Elba Barman, M.D.   Mm Digital Diagnostic Unilat R  11/07/2011   *RADIOLOGY REPORT*  Clinical Data:  Evaluate clip placement following MR guided right breast biopsy.  DIGITAL DIAGNOSTIC RIGHT MAMMOGRAM  Comparison:  [facility]  Findings:  Films are performed following MR guided biopsy of irregular MR enhancement in the subareolar right breast.  The hourglass shaped clip is in satisfactory position.  IMPRESSION: Satisfactory clip placement following MR guided right breast biopsy.  Pathology will be followed.  Original Report Authenticated By: Rosendo Gros, M.D.   Mm Radiologist Eval And Mgmt  10/14/2011  *RADIOLOGY REPORT*  ESTABLISHED PATIENT OFFICE VISIT - LEVEL II [id number])  Chief Complaint:  Post left breast biopsy  History:  Patient underwent ultrasound-guided core biopsy of an irregular 3.8 cm mass at the 12 o'clock position of the left breast.  Exam:  The biopsy site is clean and dry without signs of hematoma. There is minor skin irritation adjacent the biopsy site as the patient states she had removed the Steri-Strips in the shower.  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  This is concordant with the imaging findings.  The patient's immediate questions and concerns were addressed.  The patient was given a follow-up surgical appointment with Dr. Abbey Chatters 10/26/2011 at 10:45 a.m. and an MRI appointment at [facility] 10/21/2011 at 10:15 a.m. The patient was given educational materials concerning breast cancer.  Will await results of patient's MRI to decide if there is a need for biopsy of right breast microcalcifications.  Original Report Authenticated By: Elba Barman, M.D.     IMPRESSIONS AND PLAN:  A 50 y.o. female with locally advanced breast cancer; she will have her port inserted on 6/20 and will begin dd FEC on 6/24; she will be followed by dr Mariel Sleet in [place].     Spent more than half the time coordinating care.    Quavion Boule 6/10/20133:35 PM

## 2011-11-07 NOTE — Telephone Encounter (Signed)
Telephoned patient at home # and left message to return call to [facility] [phone].

## 2011-11-07 NOTE — Patient Instructions (Signed)
Bethany Michael  [id number] Oct 15, 1960 Dr. Glenford Peers   [facility]  Discharge Instructions  RECOMMENDATIONS MADE BY THE CONSULTANT AND ANY TEST RESULTS WILL BE SENT TO YOUR REFERRING DOCTOR.   EXAM FINDINGS BY MD TODAY AND SIGNS AND SYMPTOMS TO REPORT TO CLINIC OR PRIMARY MD: Discussion per MD.  Will get you scheduled for chemotherapy here.  MEDICATIONS PRESCRIBED: none   INSTRUCTIONS GIVEN AND DISCUSSED: Other :  Report any new lumps, bone pain, shortness of breath, fevers, etc.   SPECIAL INSTRUCTIONS/FOLLOW-UP: Return to Clinic as scheduled.   I acknowledge that I have been informed and understand all the instructions given to me and received a copy. I do not have any more questions at this time, but understand that I may call the Specialty Clinic at [facility] at [phone] during business hours should I have any further questions or need assistance in obtaining follow-up care.    __________________________________________  _____________  __________ Signature of Patient or Authorized Representative            Date                   Time    __________________________________________ Nurse's Signature

## 2011-11-08 ENCOUNTER — Telehealth: Payer: Self-pay | Admitting: *Deleted

## 2011-11-08 ENCOUNTER — Other Ambulatory Visit: Payer: No Typology Code available for payment source

## 2011-11-08 ENCOUNTER — Ambulatory Visit (HOSPITAL_COMMUNITY)
Admission: RE | Admit: 2011-11-08 | Discharge: 2011-11-08 | Disposition: A | Discharge status: Home / Self Care | Payer: Medicaid Other | Source: Ambulatory Visit | Attending: Oncology | Admitting: Oncology

## 2011-11-08 ENCOUNTER — Encounter: Payer: Self-pay | Admitting: *Deleted

## 2011-11-08 DIAGNOSIS — I079 Rheumatic tricuspid valve disease, unspecified: Secondary | ICD-10-CM | POA: Insufficient documentation

## 2011-11-08 DIAGNOSIS — I369 Nonrheumatic tricuspid valve disorder, unspecified: Secondary | ICD-10-CM

## 2011-11-08 DIAGNOSIS — C50919 Malignant neoplasm of unspecified site of unspecified female breast: Secondary | ICD-10-CM | POA: Insufficient documentation

## 2011-11-08 DIAGNOSIS — Z01818 Encounter for other preprocedural examination: Secondary | ICD-10-CM | POA: Insufficient documentation

## 2011-11-08 NOTE — Telephone Encounter (Signed)
Telephoned patient at home # and left message to return call to [phone]. Need to complete [employer] medicaid

## 2011-11-08 NOTE — Progress Notes (Signed)
  Echocardiogram 2D Echocardiogram has been performed.  Bethany Michael 11/08/2011, 10:47 AM 

## 2011-11-09 ENCOUNTER — Other Ambulatory Visit (HOSPITAL_COMMUNITY): Payer: No Typology Code available for payment source

## 2011-11-09 ENCOUNTER — Encounter: Payer: Self-pay | Admitting: *Deleted

## 2011-11-09 NOTE — Progress Notes (Signed)
Mailed after appt letter to pt. 

## 2011-11-10 ENCOUNTER — Encounter (HOSPITAL_COMMUNITY)
Admission: RE | Admit: 2011-11-10 | Discharge: 2011-11-10 | Disposition: A | Discharge status: Home / Self Care | Payer: Medicaid Other | Source: Ambulatory Visit | Attending: Oncology | Admitting: Oncology

## 2011-11-10 DIAGNOSIS — C50919 Malignant neoplasm of unspecified site of unspecified female breast: Secondary | ICD-10-CM | POA: Insufficient documentation

## 2011-11-10 DIAGNOSIS — N200 Calculus of kidney: Secondary | ICD-10-CM | POA: Insufficient documentation

## 2011-11-10 MED ORDER — FLUDEOXYGLUCOSE F - 18 (FDG) INJECTION
19.5000 | Freq: Once | INTRAVENOUS | Status: AC | PRN
  Administered 2011-11-10: 19.5 via INTRAVENOUS

## 2011-11-14 ENCOUNTER — Encounter (HOSPITAL_BASED_OUTPATIENT_CLINIC_OR_DEPARTMENT_OTHER): Payer: Medicaid Other | Admitting: Oncology

## 2011-11-14 VITALS — BP 136/75 | HR 82 | Temp 97.5°F | Wt 152.7 lb

## 2011-11-14 DIAGNOSIS — C50919 Malignant neoplasm of unspecified site of unspecified female breast: Secondary | ICD-10-CM

## 2011-11-14 NOTE — Progress Notes (Addendum)
No primary provider on file. No primary provider on file.  1. Breast cancer-left breast     INTERVAL HISTORY: Bethany Michael 51 y.o. female returns for  regular  visit for followup of locally advanced left breast cancer.   The patient is scheduled to have a port placed by interventional radiology on Thursday in preparation for neoadjuvant chemotherapy which will begin next week.  She was initially seen by Dr. Pierce Crane (Hem/Onc) who recommended and built dose-dense FEC x 6 cycles.  She is scheduled to embark on therapy, her first cycle, on 11/21/2011.  She reports that she noticed a small mass approximately 5 years ago when her husband was sick, but she basically ignored it until it became painful and began to distort her breast.  She does report occasional nipple discharge.  She reports that she began noticing some left UE edema, which is not present today.   So her plan for now is to undergo neoadjuvant chemotherapy with FEC dose-dense x 6 cycles and then proceed to surgical intervention.  She may need to be considered for a sentinel node biopsy.  We will start chemotherapy in 1 week.  We discussed the goal of neoadjuvant chemotherapy to shrink the tumor size and allow for a better surgical procedure.  She understands this and is ready to begin therapy.     Past Medical History  Diagnosis Date  . Cancer     breast    has Breast cancer-left breast on her problem list.      has no known allergies.  Ms. Ellender does not currently have medications on file.  Past Surgical History  Procedure Date  . Cesarean section     3 previous    Denies any headaches, dizziness, double vision, fevers, chills, night sweats, nausea, vomiting, diarrhea, constipation, chest pain, heart palpitations, shortness of breath, blood in stool, black tarry stool, urinary pain, urinary burning, urinary frequency, hematuria.   PHYSICAL EXAMINATION  ECOG PERFORMANCE STATUS: 1 - Symptomatic but completely  ambulatory  Filed Vitals:   11/14/11 1618  BP: 136/75  Pulse: 82  Temp: 97.5 F (36.4 C)    GENERAL:alert, no distress, well nourished, well developed, comfortable, cooperative and smiling SKIN: skin color, texture, turgor are normal, no rashes or significant lesions HEAD: Normocephalic, No masses, lesions, tenderness or abnormalities EYES: normal, Conjunctiva are pink and non-injected EARS: External ears normal OROPHARYNX:lips, buccal mucosa, and tongue normal and mucous membranes are moist  NECK: supple, no adenopathy, no bruits, thyroid normal size, non-tender, without nodularity, no stridor, non-tender, trachea midline LYMPH:  no palpable lymphadenopathy, no hepatosplenomegaly BREAST:right breast with an ecchymosis from biopsy with glandular tissue appreciated deep to the areolar complex without anyaxillary nodes, left breast with a large 5 cm mass at the 1 oclock position with a resolving lateral rash.  Left axillary nodule <0.5 cm anterior axillary line LUNGS: clear to auscultation and percussion HEART: regular rate & rhythm, no murmurs, no gallops, S1 normal and S2 normal ABDOMEN:abdomen soft, non-tender, normal bowel sounds, no masses or organomegaly and no hepatosplenomegaly BACK: Back symmetric, no curvature. EXTREMITIES:less then 2 second capillary refill, no joint deformities, effusion, or inflammation, no edema, no skin discoloration, no clubbing, no cyanosis  NEURO: alert & oriented x 3 with fluent speech, no focal motor/sensory deficits, gait normal    RADIOGRAPHIC STUDIES: 11/10/2011  *RADIOLOGY REPORT*  Clinical Data: Initial treatment strategy for breast cancer.  NUCLEAR MEDICINE PET SKULL BASE TO THIGH  Fasting Blood Glucose: 95  Technique:  19.5 mCi F-18 FDG was injected intravenously. CT data  was obtained and used for attenuation correction and anatomic  localization only. (This was not acquired as a diagnostic CT  examination.) Additional exam technical data  entered on  technologist worksheet.  Comparison: Breast MR 10/21/2011.  Findings:  Neck: Focal hypermetabolism is seen in the anterior portion of the  right lobe of the thyroid, with an S U V max of 10.9. There may be  a corresponding low attenuation lesion, measuring approximately 8  mm. No additional areas of abnormal hypermetabolism in the neck.  CT images show no acute findings.  Chest: A spiculated mass in the left breast has an S U V max of  14.7. There is patchy uptake within nodular soft tissue in the  right breast, with an S U V max of 4.3. No hypermetabolic internal  mammary, mediastinal or hilar lymph nodes.  CT images show no acute findings. No pericardial or pleural  effusion.  Abdomen/Pelvis: No abnormal hypermetabolic activity within the  liver, pancreas, adrenal glands, or spleen. No hypermetabolic  lymph nodes in the abdomen or pelvis.  CT images show no acute findings. A small stone is seen in the  left kidney. No obstruction. No free fluid.  [place]: No focal hypermetabolic activity to suggest skeletal  metastasis.  IMPRESSION:  1. Malignant left breast mass without evidence of nodal or distant  metastatic disease.  2. Nodular uptake in the right breast, worrisome for malignancy.  3. Focal hypermetabolism in the right lobe of the thyroid, with  suspected underlying low attenuation nodule. Sonographic  evaluation is recommended, as malignancy cannot be excluded.  4. Left renal stone.  Original Report Authenticated By: Reyes Ivan, M.D.    PATHOLOGY: 10/13/2011  ADDITIONAL INFORMATION: PROGNOSTIC INDICATORS - ACIS Results IMMUNOHISTOCHEMICAL AND MORPHOMETRIC ANALYSIS BY THE AUTOMATED CELLULAR IMAGING SYSTEM (ACIS) Estrogen Receptor (Negative, <1%): 0%, NEGATIVE Progesterone Receptor (Negative, <1%): 0%, NEGATIVE Proliferation Marker Ki67 by M IB-1 (Low<20%): 50% COMMENT: The negative hormone receptor study(ies) in this case have no internal positive  control. All controls stained appropriately Pecola Leisure MD Pathologist, Electronic Signature ( Signed 10/19/2011) CHROMOGENIC IN-SITU HYBRIDIZATION Interpretation HER-2/NEU BY CISH - NO AMPLIFICATION OF HER-2 DETECTED. THE RATIO OF HER-2: CEP 17 SIGNALS WAS 1.13. Reference range: Ratio: HER2:CEP17 < 1.8 - gene amplification not observed Ratio: HER2:CEP 17 1.8-2.2 - equivocal result Ratio: HER2:CEP17 > 2.2 - gene amplification observed 1 of 3 FINAL for ALAYCIA, EARDLEY ([id number]) ADDITIONAL INFORMATION:(continued) Pecola Leisure MD Pathologist, Electronic Signature ( Signed 10/18/2011) FINAL DIAGNOSIS Diagnosis Breast, left, needle core biopsy, mass, 12 o'clock -INVASIVE DUCTAL CARCINOMA, SEE COMMENT. Microscopic Comment Although the grade of tumor is best assessed at resection, with these biopsies, the invasive tumor is grade II. Breast prognostic studies is pending and reported in an addendum. The case is reviewed with Dr. Raynald Blend who concurs. (CR:mw 10-14-11) [place] RUND DO Pathologist, Electronic Signature (Case signed 10/14/2011)      11/07/2011  Diagnosis Breast, right, needle core biopsy, retroareolar/central - RADIAL/COMPLEX SCLEROSING LESION WITH PAPILLARY ARCHITECTURE SEE COMMENT - MICROCALCIFICATIONS PRESENT Microscopic Comment The myoepithelial layer is demonstrated with calponin, p63 and smooth muscle mycin immunohistochemical stains. Intraductal papilloma is in the differential diagnosis. Excision is recommended to further characterize the extent and severity of the lesion. The case was reviewed with Dr Colonel Bald, who concurs. (CR:kh 11-08-11) [place] RUND DO Pathologist, Electronic Signature (Case signed 11/09/2011)   11/08/2011  *[facility]* *[facility]* [address]  [place], [place] [address] [phone]  ------------------------------------------------------------ Transthoracic Echocardiography  Patient: Jerilynn, Feldmeier MR #: [id number] Study Date: 11/08/2011 Gender: F Age: 39 Height: 144.8cm Weight: 70kg BSA: 1.38m^2 Pt. Status: Room:  PERFORMING [facility] [address]ane SONOGRAPHER Melissa Morford, RDCS cc:  ------------------------------------------------------------ LV EF: 55%  ------------------------------------------------------------ Indications: V58.11 Chemotherapy Evaluation.  ------------------------------------------------------------ History: PMH: No prior cardiac history.  ------------------------------------------------------------ Study Conclusions  Left ventricle: The cavity size was normal. Wall thickness was normal. The estimated ejection fraction was 55%. Wall motion was normal; there were no regional wall motion abnormalities. Transthoracic echocardiography. M-mode, complete 2D, spectral Doppler, and color Doppler. Height: Height: 144.8cm. Height: 57in. Weight: Weight: 70kg. Weight: 154lb. Body mass index: BMI: 33.4kg/m^2. Body surface area: BSA: 1.50m^2. Blood pressure: 118/61. Patient status: Outpatient. Location: Echo laboratory.      ASSESSMENT:  1. Locally advanced, triple negative left breast cancer. 2. Focal hypermetabolism in the right lobe of the thyroid. 3. Left axillary nodule <0.5 cm anterior axillary line   PLAN:  1. I personally reviewed and went over radiographic studies with the patient. 2. I personally reviewed and went over pathology results with the patient. 3. Patient will have a port placed by IR on Thursday. 4. She is scheduled to embark on [facility] dose-dense chemotherapy on 11/21/2011. 5. Pre-chemo lab work the day of therapy: CBC diff, CMET 6. Dr. Mariel Sleet wishes to call Dr. Abbey Chatters regarding the patient's case 7. Radiation oncology consultation.  Communication sent to scheduler.  8. Return in 1 month for follow-up.  All questions were answered. The patient knows to call the clinic with any problems, questions or  concerns. We can certainly see the patient much sooner if necessary.  The patient and plan discussed with Glenford Peers, MD and he is in agreement with the aforementioned.  Patient seen and examined by Dr. Mariel Sleet.  I spent 25 minutes counseling the patient face to face. The total time spent in the appointment was 30 minutes.  Fawaz Borquez

## 2011-11-14 NOTE — Patient Instructions (Signed)
Bethany Michael  [id number] 1960/12/15 Dr. Glenford Peers   [facility]  Discharge Instructions  RECOMMENDATIONS MADE BY THE CONSULTANT AND ANY TEST RESULTS WILL BE SENT TO YOUR REFERRING DOCTOR.   EXAM FINDINGS BY MD TODAY AND SIGNS AND SYMPTOMS TO REPORT TO CLINIC OR PRIMARY MD: Exam findings as discussed by T. Jacalyn Lefevre, PA-C and Dr. Mariel Sleet.  Please feel free to call us with any further questions or concerns.  SPECIAL INSTRUCTIONS/FOLLOW-UP: 1.  Return in 4 weeks for your MD visit as scheduled. 2.  Keep your scheduled port-a-cath placement appointment. 3.  We will be contacting you with an appointment with Radiation Therapy in the near future.  I acknowledge that I have been informed and understand all the instructions given to me and received a copy. I do not have any more questions at this time, but understand that I may call the Specialty Clinic at [facility] at [phone] during business hours should I have any further questions or need assistance in obtaining follow-up care.    __________________________________________  _____________  __________ Signature of Patient or Authorized Representative            Date                   Time    __________________________________________ Nurse's Signature

## 2011-11-16 NOTE — Progress Notes (Signed)
Pt had PET scan 6/13-included chest area Arrive with x-spouse-no labs needed

## 2011-11-17 ENCOUNTER — Ambulatory Visit (HOSPITAL_COMMUNITY): Payer: Medicaid Other

## 2011-11-17 ENCOUNTER — Encounter (HOSPITAL_BASED_OUTPATIENT_CLINIC_OR_DEPARTMENT_OTHER): Payer: Self-pay | Admitting: *Deleted

## 2011-11-17 ENCOUNTER — Ambulatory Visit (HOSPITAL_BASED_OUTPATIENT_CLINIC_OR_DEPARTMENT_OTHER): Payer: Medicaid Other | Admitting: *Deleted

## 2011-11-17 ENCOUNTER — Encounter (HOSPITAL_BASED_OUTPATIENT_CLINIC_OR_DEPARTMENT_OTHER): Payer: Self-pay

## 2011-11-17 ENCOUNTER — Ambulatory Visit (HOSPITAL_BASED_OUTPATIENT_CLINIC_OR_DEPARTMENT_OTHER)
Admission: RE | Admit: 2011-11-17 | Discharge: 2011-11-17 | Disposition: A | Discharge status: Home / Self Care | Payer: Medicaid Other | Source: Ambulatory Visit | Attending: General Surgery | Admitting: General Surgery

## 2011-11-17 ENCOUNTER — Encounter (HOSPITAL_BASED_OUTPATIENT_CLINIC_OR_DEPARTMENT_OTHER)
Admission: RE | Disposition: A | Discharge status: Home / Self Care | Payer: Self-pay | Source: Ambulatory Visit | Attending: General Surgery

## 2011-11-17 DIAGNOSIS — C50919 Malignant neoplasm of unspecified site of unspecified female breast: Secondary | ICD-10-CM

## 2011-11-17 HISTORY — PX: PORTACATH PLACEMENT: SHX2246

## 2011-11-17 SURGERY — INSERTION, TUNNELED CENTRAL VENOUS DEVICE, WITH PORT
Anesthesia: Choice | Site: Chest | Wound class: Clean

## 2011-11-17 MED ORDER — CEFAZOLIN SODIUM 1-5 GM-% IV SOLN
1.0000 g | INTRAVENOUS | Status: AC
  Administered 2011-11-17: 1 g via INTRAVENOUS

## 2011-11-17 MED ORDER — LIDOCAINE HCL (PF) 1 % IJ SOLN
INTRAMUSCULAR | Status: DC | PRN
  Administered 2011-11-17: 10 mL

## 2011-11-17 MED ORDER — LIDOCAINE HCL (CARDIAC) 20 MG/ML IV SOLN
INTRAVENOUS | Status: DC | PRN
  Administered 2011-11-17: 75 mg via INTRAVENOUS
  Administered 2011-11-17: 25 mg via INTRAVENOUS

## 2011-11-17 MED ORDER — HYDROCODONE-ACETAMINOPHEN 10-325 MG PO TABS: 1.0000 | ORAL_TABLET | Freq: Four times a day (QID) | ORAL | Status: DC | PRN

## 2011-11-17 MED ORDER — DEXAMETHASONE SODIUM PHOSPHATE 4 MG/ML IJ SOLN
INTRAMUSCULAR | Status: DC | PRN
  Administered 2011-11-17: 10 mg via INTRAVENOUS

## 2011-11-17 MED ORDER — HEPARIN SOD (PORK) LOCK FLUSH 100 UNIT/ML IV SOLN
INTRAVENOUS | Status: DC | PRN
  Administered 2011-11-17: 500 [IU]

## 2011-11-17 MED ORDER — FENTANYL CITRATE 0.05 MG/ML IJ SOLN
INTRAMUSCULAR | Status: DC | PRN
  Administered 2011-11-17 (×2): 50 ug via INTRAVENOUS

## 2011-11-17 MED ORDER — ONDANSETRON HCL 4 MG/2ML IJ SOLN
4.0000 mg | Freq: Four times a day (QID) | INTRAMUSCULAR | Status: AC | PRN
  Administered 2011-11-17: 4 mg via INTRAVENOUS

## 2011-11-17 MED ORDER — PROPOFOL 10 MG/ML IV EMUL
INTRAVENOUS | Status: DC | PRN
  Administered 2011-11-17: 200 mg via INTRAVENOUS
  Administered 2011-11-17: 100 mg via INTRAVENOUS

## 2011-11-17 MED ORDER — LACTATED RINGERS IV SOLN
INTRAVENOUS | Status: DC
  Administered 2011-11-17: 20 mL/h via INTRAVENOUS
  Administered 2011-11-17 (×2): via INTRAVENOUS

## 2011-11-17 MED ORDER — EPHEDRINE SULFATE 50 MG/ML IJ SOLN
INTRAMUSCULAR | Status: DC | PRN
  Administered 2011-11-17: 10 mg via INTRAVENOUS
  Administered 2011-11-17: 5 mg via INTRAVENOUS

## 2011-11-17 MED ORDER — FENTANYL CITRATE 0.05 MG/ML IJ SOLN: 25.0000 ug | INTRAMUSCULAR | Status: DC | PRN

## 2011-11-17 MED ORDER — ONDANSETRON HCL 4 MG PO TABS: 4.0000 mg | ORAL_TABLET | ORAL | Status: AC | PRN

## 2011-11-17 MED ORDER — ONDANSETRON HCL 4 MG/2ML IJ SOLN
INTRAMUSCULAR | Status: DC | PRN
  Administered 2011-11-17: 4 mg via INTRAVENOUS

## 2011-11-17 SURGICAL SUPPLY — 55 items
BAG DECANTER FOR FLEXI CONT (MISCELLANEOUS) ×2 IMPLANT
BENZOIN TINCTURE PRP APPL 2/3 (GAUZE/BANDAGES/DRESSINGS) ×2 IMPLANT
BLADE SURG 15 STRL LF DISP TIS (BLADE) ×1 IMPLANT
BLADE SURG 15 STRL SS (BLADE) ×1
BLADE SURG ROTATE 9660 (MISCELLANEOUS) IMPLANT
CANISTER SUCTION 1200CC (MISCELLANEOUS) IMPLANT
CHLORAPREP W/TINT 26ML (MISCELLANEOUS) ×2 IMPLANT
CLEANER CAUTERY TIP 5X5 PAD (MISCELLANEOUS) ×1 IMPLANT
CLOTH BEACON ORANGE TIMEOUT ST (SAFETY) ×2 IMPLANT
COVER MAYO STAND STRL (DRAPES) ×2 IMPLANT
COVER PROBE 5X48 (MISCELLANEOUS)
COVER TABLE BACK 60X90 (DRAPES) ×2 IMPLANT
DECANTER SPIKE VIAL GLASS SM (MISCELLANEOUS) IMPLANT
DRAPE C-ARM 42X72 X-RAY (DRAPES) ×2 IMPLANT
DRAPE LAPAROTOMY TRNSV 102X78 (DRAPE) ×2 IMPLANT
DRAPE UTILITY XL STRL (DRAPES) ×2 IMPLANT
DRSG TEGADERM 2-3/8X2-3/4 SM (GAUZE/BANDAGES/DRESSINGS) ×2 IMPLANT
DRSG TEGADERM 4X4.75 (GAUZE/BANDAGES/DRESSINGS) ×2 IMPLANT
ELECT REM PT RETURN 9FT ADLT (ELECTROSURGICAL) ×2
ELECTRODE REM PT RTRN 9FT ADLT (ELECTROSURGICAL) ×1 IMPLANT
GAUZE SPONGE 4X4 12PLY STRL LF (GAUZE/BANDAGES/DRESSINGS) IMPLANT
GAUZE SPONGE 4X4 16PLY XRAY LF (GAUZE/BANDAGES/DRESSINGS) IMPLANT
GLOVE BIOGEL M 7.0 STRL (GLOVE) ×2 IMPLANT
GLOVE BIOGEL PI IND STRL 7.5 (GLOVE) ×1 IMPLANT
GLOVE BIOGEL PI IND STRL 8.5 (GLOVE) ×1 IMPLANT
GLOVE BIOGEL PI INDICATOR 7.5 (GLOVE) ×1
GLOVE BIOGEL PI INDICATOR 8.5 (GLOVE) ×1
GLOVE ECLIPSE 8.0 STRL XLNG CF (GLOVE) ×2 IMPLANT
GOWN PREVENTION PLUS XLARGE (GOWN DISPOSABLE) ×2 IMPLANT
GOWN PREVENTION PLUS XXLARGE (GOWN DISPOSABLE) ×2 IMPLANT
IV CATH PLACEMENT UNIT 16 GA (IV SOLUTION) ×2 IMPLANT
IV HEPARIN 1000UNITS/500ML (IV SOLUTION) ×2 IMPLANT
IV KIT MINILOC 20X1 SAFETY (NEEDLE) IMPLANT
KIT CVR 48X5XPRB PLUP LF (MISCELLANEOUS) IMPLANT
KIT POWER CATH 8FR (Catheter) ×2 IMPLANT
NEEDLE HYPO 22GX1.5 SAFETY (NEEDLE) IMPLANT
NEEDLE HYPO 25X1 1.5 SAFETY (NEEDLE) ×2 IMPLANT
PACK BASIN DAY SURGERY FS (CUSTOM PROCEDURE TRAY) ×2 IMPLANT
PAD CLEANER CAUTERY TIP 5X5 (MISCELLANEOUS) ×1
PENCIL BUTTON HOLSTER BLD 10FT (ELECTRODE) ×2 IMPLANT
SET SHEATH INTRODUCER 10FR (MISCELLANEOUS) IMPLANT
SLEEVE SCD COMPRESS KNEE MED (MISCELLANEOUS) ×2 IMPLANT
SPONGE GAUZE 2X2 8PLY STRL LF (GAUZE/BANDAGES/DRESSINGS) ×2 IMPLANT
STRIP CLOSURE SKIN 1/2X4 (GAUZE/BANDAGES/DRESSINGS) ×2 IMPLANT
SUT MNCRL AB 4-0 PS2 18 (SUTURE) ×2 IMPLANT
SUT VIC AB 2-0 SH 18 (SUTURE) ×2 IMPLANT
SUT VIC AB 3-0 SH 27 (SUTURE) ×1
SUT VIC AB 3-0 SH 27X BRD (SUTURE) ×1 IMPLANT
SYR 5ML LUER SLIP (SYRINGE) ×2 IMPLANT
SYR CONTROL 10ML LL (SYRINGE) ×2 IMPLANT
TOWEL OR 17X24 6PK STRL BLUE (TOWEL DISPOSABLE) ×4 IMPLANT
TOWEL OR NON WOVEN STRL DISP B (DISPOSABLE) ×2 IMPLANT
TUBE CONNECTING 20X1/4 (TUBING) IMPLANT
WATER STERILE IRR 1000ML POUR (IV SOLUTION) IMPLANT
YANKAUER SUCT BULB TIP NO VENT (SUCTIONS) IMPLANT

## 2011-11-17 NOTE — Discharge Instructions (Addendum)
May shower tomorrow. Leave the bandage on.  Do not lift anything heavier than 10 pounds with right arm for 2 weeks.  Avoid lifting right arm over head as much as possible.  Call for heavy bleeding, signs of infection, wound healing problems, sudden shortness of breath.  Take Norco for pain as needed.  Appointment with Dr. Abbey Chatters in 2 months.  Call [phone] to make this appointment.    Post Anesthesia Home Care Instructions  Activity: Get plenty of rest for the remainder of the day. A responsible adult should stay with you for 24 hours following the procedure.  For the next 24 hours, DO NOT: -Drive a car -Advertising copywriter -Drink alcoholic beverages -Take any medication unless instructed by your physician -Make any legal decisions or sign important papers.  Meals: Start with liquid foods such as gelatin or soup. Progress to regular foods as tolerated. Avoid greasy, spicy, heavy foods. If nausea and/or vomiting occur, drink only clear liquids until the nausea and/or vomiting subsides. Call your physician if vomiting continues.  Special Instructions/Symptoms: Your throat may feel dry or sore from the anesthesia or the breathing tube placed in your throat during surgery. If this causes discomfort, gargle with warm salt water. The discomfort should disappear within 24 hours.

## 2011-11-17 NOTE — Anesthesia Procedure Notes (Signed)
Procedure Name: LMA Insertion Date/Time: 11/17/2011 8:09 AM Performed by: Meyer Russel Pre-anesthesia Checklist: Patient identified, Emergency Drugs available, Suction available and Patient being monitored Patient Re-evaluated:Patient Re-evaluated prior to inductionOxygen Delivery Method: Circle System Utilized Preoxygenation: Pre-oxygenation with 100% oxygen Intubation Type: IV induction Ventilation: Mask ventilation without difficulty LMA: LMA inserted LMA Size: 3.0 Number of attempts: 1 Airway Equipment and Method: bite block Placement Confirmation: positive ETCO2 and breath sounds checked- equal and bilateral Tube secured with: Tape Dental Injury: Teeth and Oropharynx as per pre-operative assessment

## 2011-11-17 NOTE — Anesthesia Preprocedure Evaluation (Addendum)
Anesthesia Evaluation  Patient identified by MRN, date of birth, ID band Patient awake    Reviewed: Allergy & Precautions, H&P , NPO status , Patient's Chart, lab work & pertinent test results  Airway Mallampati: II  Neck ROM: full    Dental   Pulmonary          Cardiovascular     Neuro/Psych    GI/Hepatic   Endo/Other    Renal/GU      Musculoskeletal   Abdominal   Peds  Hematology   Anesthesia Other Findings   Reproductive/Obstetrics                           Anesthesia Physical Anesthesia Plan  ASA: II  Anesthesia Plan: General   Post-op Pain Management:    Induction: Intravenous  Airway Management Planned: LMA  Additional Equipment:   Intra-op Plan:   Post-operative Plan:   Informed Consent: I have reviewed the patients History and Physical, chart, labs and discussed the procedure including the risks, benefits and alternatives for the proposed anesthesia with the patient or authorized representative who has indicated his/her understanding and acceptance.   Dental Advisory Given  Plan Discussed with: CRNA and Surgeon  Anesthesia Plan Comments:        Anesthesia Quick Evaluation

## 2011-11-17 NOTE — Anesthesia Postprocedure Evaluation (Signed)
Anesthesia Post Note  Patient: Bethany Michael  Procedure(s) Performed: Procedure(s) (LRB): INSERTION PORT-A-CATH (N/A)  Anesthesia type: General  Patient location: PACU  Post pain: Pain level controlled and Adequate analgesia  Post assessment: Post-op Vital signs reviewed, Patient's Cardiovascular Status Stable, Respiratory Function Stable, Patent Airway and Pain level controlled  Last Vitals:  Filed Vitals:   11/17/11 0930  BP: 134/70  Pulse: 70  Temp:   Resp: 17    Post vital signs: Reviewed and stable  Level of consciousness: awake, alert  and oriented  Complications: No apparent anesthesia complications

## 2011-11-17 NOTE — H&P (View-Only) (Signed)
Patient ID: Bethany Michael, female   DOB: 1960-08-22, 51 y.o.   MRN: [id number]  Chief Complaint  Patient presents with  . Breast Cancer    HPI Bethany Michael is a 51 y.o. female.   HPI  She is referred by Dr. Elberta Fortis at the [facility] of [place] for evaluation of a newly diagnosed left breast cancer.  She noticed a small left breast mass at least 5 years ago and presented to her PCP because of that.  A plan of action was suggested but she did not go through with it because her husband was diagnosed with prostate cancer at the time.  The mass has been getting larger and show noticed some transient left arm swelling.  The arm swelling has resolved but it prompted her to seek medical attention for the mass.  She underwent a mammogram and [place] which demonstrated a 3.8 cm left breast mass.  There were also some areas of concern in the right breast.  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  Proliferation rate is 67%.  MRI demonstrated the left breast masses as well as worrisome areas in  The right breast.  She has an appointment at 1:40 pm today for repeat US and possible biopsy of the suspicious areas in the right breast.  There is no family history of breast cancer.  Age at menarche was 17.  Age at first childbirth was 89.  She is still having menstrual periods.  The mass is not painful.  Past Medical History  Diagnosis Date  . Cancer     breast    Past Surgical History  Procedure Date  . Cesarean section     3 previous    Family History  Problem Relation Age of Onset  . Diabetes Mother     Social History History  Substance Use Topics  . Smoking status: Never Smoker   . Smokeless tobacco: Never Used  . Alcohol Use: Yes     occassionally    No Known Allergies  No current outpatient prescriptions on file.    Review of Systems Review of Systems  Constitutional: Negative.   HENT: Negative.     Respiratory: Negative.   Cardiovascular: Negative.   Gastrointestinal: Negative.   Genitourinary: Negative.   Musculoskeletal: Negative.   Neurological: Negative.   Hematological: Negative.     Blood pressure 114/72, pulse 70, temperature 96.9 F (36.1 C), temperature source Temporal, resp. rate 16, height 5\' 7"  (1.702 m), weight 155 lb (70.308 kg), last menstrual period 09/21/2011.  Physical Exam Physical Exam  Constitutional: She appears well-developed and well-nourished. No distress.  HENT:  Head: Normocephalic and atraumatic.  Eyes: Pupils are equal, round, and reactive to light. No scleral icterus.  Neck: Normal range of motion.  Cardiovascular: Normal rate and regular rhythm.   Pulmonary/Chest: Effort normal and breath sounds normal.       4-5 cm left breast mass at the 1:00 position nipple-areolar complex with an overlying skin rash; right breast demonstrateds irregularity in the superior aspect of the breast , but no dominant mass  Abdominal: Soft. She exhibits no distension and no mass. There is no tenderness.  Musculoskeletal: Normal range of motion. She exhibits no edema.       No axillary or supraclavicular adenopathy.  The arm swelling.  Lymphadenopathy:    She has no cervical adenopathy.  Neurological: She is alert.  Skin: Skin is warm and  dry.  Psychiatric: She has a normal mood and affect. Her behavior is normal.    Data Reviewed Imaging studies, pathology report  Assessment    Invasive left breast cancer, triple negative.  Also, has suspicious lesions in the right breast.  She told me that she has done some research on breast cancer and wants to have bilateral mastectomies.    Plan    Keep appointment with [facility] today.  Referral to Medical Oncology.  If after these appointments, she still wants to proceed with bilateral mastectomies rather than neoadjuvant chemotherapy, we will proceed with this as well as axillary sentinel lymph node biopsy.  I  have explained the procedure, risks, and aftercare to her.  Risks include but are not limited to bleeding, infection, wound problems, anesthesia, chronic chest wall pain, nerve injury, seroma formation, lymphedema.  She seems to understand all of this.       Batsheva Stevick J 10/26/2011, 2:03 PM

## 2011-11-17 NOTE — Progress Notes (Signed)
Dr Abbey Chatters aware of chest x-ray result

## 2011-11-17 NOTE — Transfer of Care (Signed)
Immediate Anesthesia Transfer of Care Note  Patient: Bethany Michael  Procedure(s) Performed: Procedure(s) (LRB): INSERTION PORT-A-CATH (N/A)  Patient Location: PACU  Anesthesia Type: General  Level of Consciousness: sedated  Airway & Oxygen Therapy: Patient Spontanous Breathing and Patient connected to face mask oxygen  Post-op Assessment: Report given to PACU RN and Post -op Vital signs reviewed and stable  Post vital signs: Reviewed and stable  Complications: No apparent anesthesia complications

## 2011-11-17 NOTE — Interval H&P Note (Signed)
History and Physical Interval Note:  11/17/2011 7:58 AM  Bethany Michael  has presented today for surgery, with the diagnosis of Breast cancer  The various methods of treatment have been discussed with the patient and family. After consideration of risks, benefits and other options for treatment, the patient has consented to  Procedure(s) (LRB): INSERTION PORT-A-CATH (N/A) as a surgical intervention .  The patient's history has been reviewed, patient examined, no change in status, stable for surgery.  I have reviewed the patients' chart and labs.  Questions were answered to the patient's satisfaction.     Tonae Livolsi Shela Commons

## 2011-11-17 NOTE — Op Note (Signed)
Preoperative diagnosis:  Left Breast Cancer  Postoperative diagnosis:  Same  Procedure: Ultrasound-guided Port-A-Cath insertion into right internal jugular vein under fluoroscopy.  Surgeon: Avel Peace M.D.  Anesthesia: Local (Xylocaine) with MAC.  Indication:  This is a 51 year old female with a 5 cm left breast cancer. She is going to undergo neoadjuvant chemotherapy. She now presents for Port-A-Cath insertion.The procedure, risks, and after care have been discussed with her.  Technique: She was brought to the operating room, placed supine on the operating table, and Gen. anesthesia was given. A roll was placed under the back and the arms were tucked. . The upper chest wall and neck were sterilely prepped and draped.  The patient's head was rotated to the left. Using the ultrasound, the right internal jugular vein was identified. Local anesthetic was infiltrated in the skin and subcutaneous tissue just anterior to it. A 16-gauge needle was used to cannulate the right internal jugular vein under ultrasound guidance. A wire was then threaded through the needle into the internal jugular vein, but did not thread well and was removed.  The needle was readjusted and the carotid artery was cannulated and the needle was removed and direct pressure was held.   Under ultrasound guidance, I cannulated the right internal jugular vein again and was able to pass a wire. Using fluoroscopy the wire was confirmed to be in appropriate position.  Local anesthetic was infiltrated into the right upper chest wall. A right upper chest wall incision was made and a pocket was created for the Portacath.  An incision was made around the wire in the neck. The catheter was then tunneled from the chest wall incision up through the neck incision.  A dilator- introducer complex was placed over the wire into the superior vena cava. The dilator and wire were then removed and the catheter was threaded through the peel-away  sheath introducer into the right heart. The introducer was then peeled away and removed. Under fluoroscopic guidance, the tip of the catheter was then pulled back until it was at the junction of the superior vena cava and right atrium. The catheter was then connected to the port.  The port aspirated blood and flushed easily.  The port was then anchored to the chest wall with 2-0 Vicryl suture. Concentrated heparin solution was then placed into the port. The port and catheter position were then verified using fluoroscopy. The subcutaneous tissue was then closed over the port with running 3-0 Vicryl suture. The skin incisions were then closed with 4-0 Monocryl subcuticular stitches. Steri-Strips and sterile dressings were applied.  The procedure was well-tolerated without any apparent complications. The patient was taken to the recovery room in satisfactory condition where a portable chest x-ray is pending.

## 2011-11-18 LAB — POCT HEMOGLOBIN-HEMACUE: Hemoglobin: 13.5 g/dL (ref 12.0–15.0)

## 2011-11-21 ENCOUNTER — Other Ambulatory Visit (HOSPITAL_COMMUNITY): Payer: Self-pay | Admitting: Oncology

## 2011-11-21 ENCOUNTER — Encounter (HOSPITAL_COMMUNITY): Payer: Medicaid Other

## 2011-11-21 ENCOUNTER — Encounter (HOSPITAL_BASED_OUTPATIENT_CLINIC_OR_DEPARTMENT_OTHER): Payer: Medicaid Other

## 2011-11-21 ENCOUNTER — Encounter (HOSPITAL_BASED_OUTPATIENT_CLINIC_OR_DEPARTMENT_OTHER): Payer: Self-pay | Admitting: General Surgery

## 2011-11-21 VITALS — BP 108/41 | HR 52 | Temp 97.4°F | Wt 151.6 lb

## 2011-11-21 DIAGNOSIS — Z5111 Encounter for antineoplastic chemotherapy: Secondary | ICD-10-CM

## 2011-11-21 DIAGNOSIS — C50919 Malignant neoplasm of unspecified site of unspecified female breast: Secondary | ICD-10-CM

## 2011-11-21 DIAGNOSIS — Z171 Estrogen receptor negative status [ER-]: Secondary | ICD-10-CM

## 2011-11-21 LAB — CBC
HCT: 41 % (ref 36.0–46.0)
MCHC: 33.7 g/dL (ref 30.0–36.0)
MCV: 92.8 fL (ref 78.0–100.0)
Platelets: 203 10*3/uL (ref 150–400)
RDW: 13.4 % (ref 11.5–15.5)

## 2011-11-21 LAB — DIFFERENTIAL
Basophils Absolute: 0 10*3/uL (ref 0.0–0.1)
Basophils Relative: 1 % (ref 0–1)
Eosinophils Relative: 3 % (ref 0–5)
Monocytes Absolute: 0.3 10*3/uL (ref 0.1–1.0)

## 2011-11-21 LAB — COMPREHENSIVE METABOLIC PANEL
AST: 12 U/L (ref 0–37)
Albumin: 4 g/dL (ref 3.5–5.2)
Calcium: 9.8 mg/dL (ref 8.4–10.5)
Creatinine, Ser: 0.64 mg/dL (ref 0.50–1.10)
Sodium: 138 mEq/L (ref 135–145)
Total Protein: 7.1 g/dL (ref 6.0–8.3)

## 2011-11-21 MED ORDER — SODIUM CHLORIDE 0.9 % IV SOLN
Freq: Once | INTRAVENOUS | Status: AC
  Administered 2011-11-21: 11:00:00 via INTRAVENOUS
  Filled 2011-11-21: qty 5

## 2011-11-21 MED ORDER — PALONOSETRON HCL INJECTION 0.25 MG/5ML
0.2500 mg | Freq: Once | INTRAVENOUS | Status: AC
  Administered 2011-11-21: 0.25 mg via INTRAVENOUS

## 2011-11-21 MED ORDER — HEPARIN SOD (PORK) LOCK FLUSH 100 UNIT/ML IV SOLN
INTRAVENOUS | Status: AC
  Filled 2011-11-21: qty 5

## 2011-11-21 MED ORDER — FLUOROURACIL CHEMO INJECTION 2.5 GM/50ML
500.0000 mg/m2 | Freq: Once | INTRAVENOUS | Status: AC
  Administered 2011-11-21: 900 mg via INTRAVENOUS
  Filled 2011-11-21: qty 18

## 2011-11-21 MED ORDER — PALONOSETRON HCL INJECTION 0.25 MG/5ML
INTRAVENOUS | Status: AC
  Filled 2011-11-21: qty 5

## 2011-11-21 MED ORDER — SODIUM CHLORIDE 0.9 % IV SOLN: 150.0000 mg | Freq: Once | INTRAVENOUS | Status: DC

## 2011-11-21 MED ORDER — EPIRUBICIN HCL CHEMO IV INJECTION 200 MG/100ML
100.0000 mg/m2 | Freq: Once | INTRAVENOUS | Status: AC
  Administered 2011-11-21: 182 mg via INTRAVENOUS
  Filled 2011-11-21: qty 91

## 2011-11-21 MED ORDER — HEPARIN SOD (PORK) LOCK FLUSH 100 UNIT/ML IV SOLN
500.0000 [IU] | Freq: Once | INTRAVENOUS | Status: AC | PRN
  Administered 2011-11-21: 500 [IU]
  Filled 2011-11-21: qty 5

## 2011-11-21 MED ORDER — SODIUM CHLORIDE 0.9 % IV SOLN
500.0000 mg/m2 | Freq: Once | INTRAVENOUS | Status: AC
  Administered 2011-11-21: 920 mg via INTRAVENOUS
  Filled 2011-11-21: qty 46

## 2011-11-21 MED ORDER — DEXAMETHASONE SODIUM PHOSPHATE 4 MG/ML IJ SOLN: 12.0000 mg | Freq: Once | INTRAMUSCULAR | Status: DC

## 2011-11-21 MED ORDER — SODIUM CHLORIDE 0.9 % IJ SOLN
10.0000 mL | INTRAMUSCULAR | Status: DC | PRN
  Filled 2011-11-21: qty 10

## 2011-11-21 MED ORDER — SODIUM CHLORIDE 0.9 % IV SOLN
Freq: Once | INTRAVENOUS | Status: AC
  Administered 2011-11-21: 09:00:00 via INTRAVENOUS

## 2011-11-22 ENCOUNTER — Encounter (HOSPITAL_BASED_OUTPATIENT_CLINIC_OR_DEPARTMENT_OTHER): Payer: Medicaid Other

## 2011-11-22 ENCOUNTER — Ambulatory Visit (HOSPITAL_COMMUNITY): Payer: No Typology Code available for payment source

## 2011-11-22 VITALS — BP 106/39 | HR 73

## 2011-11-22 DIAGNOSIS — Z171 Estrogen receptor negative status [ER-]: Secondary | ICD-10-CM

## 2011-11-22 DIAGNOSIS — C50919 Malignant neoplasm of unspecified site of unspecified female breast: Secondary | ICD-10-CM

## 2011-11-22 MED ORDER — PEGFILGRASTIM INJECTION 6 MG/0.6ML
6.0000 mg | Freq: Once | SUBCUTANEOUS | Status: AC
  Administered 2011-11-22: 6 mg via SUBCUTANEOUS

## 2011-11-22 MED ORDER — PEGFILGRASTIM INJECTION 6 MG/0.6ML
SUBCUTANEOUS | Status: AC
  Filled 2011-11-22: qty 0.6

## 2011-11-22 NOTE — Progress Notes (Signed)
Kerryann K Rentz presents today for injection per MD orders. Neulasta 6mg administered SQ in left Abdomen. Administration without incident. Patient tolerated well.  

## 2011-11-26 ENCOUNTER — Encounter (HOSPITAL_COMMUNITY): Payer: Self-pay | Admitting: *Deleted

## 2011-11-26 NOTE — Progress Notes (Signed)
Review of chemo teaching done 11/08/2011 done at [facility]

## 2011-12-05 ENCOUNTER — Encounter (HOSPITAL_COMMUNITY): Payer: Medicaid Other

## 2011-12-05 ENCOUNTER — Encounter (HOSPITAL_COMMUNITY): Payer: Medicaid Other | Attending: Oncology

## 2011-12-05 VITALS — BP 100/66 | HR 70 | Temp 98.1°F | Wt 150.4 lb

## 2011-12-05 DIAGNOSIS — Z5111 Encounter for antineoplastic chemotherapy: Secondary | ICD-10-CM

## 2011-12-05 DIAGNOSIS — C50919 Malignant neoplasm of unspecified site of unspecified female breast: Secondary | ICD-10-CM | POA: Insufficient documentation

## 2011-12-05 LAB — DIFFERENTIAL
Eosinophils Absolute: 0 10*3/uL (ref 0.0–0.7)
Eosinophils Relative: 1 % (ref 0–5)
Lymphs Abs: 1.2 10*3/uL (ref 0.7–4.0)
Monocytes Absolute: 0.7 10*3/uL (ref 0.1–1.0)
Monocytes Relative: 17 % — ABNORMAL HIGH (ref 3–12)
Neutrophils Relative %: 53 % (ref 43–77)

## 2011-12-05 LAB — CBC
HCT: 38 % (ref 36.0–46.0)
Hemoglobin: 12.4 g/dL (ref 12.0–15.0)
MCH: 30.1 pg (ref 26.0–34.0)
MCV: 92.2 fL (ref 78.0–100.0)
RBC: 4.12 MIL/uL (ref 3.87–5.11)

## 2011-12-05 LAB — BASIC METABOLIC PANEL
BUN: 12 mg/dL (ref 6–23)
CO2: 27 mEq/L (ref 19–32)
Calcium: 9.6 mg/dL (ref 8.4–10.5)
Glucose, Bld: 84 mg/dL (ref 70–99)
Sodium: 140 mEq/L (ref 135–145)

## 2011-12-05 MED ORDER — SODIUM CHLORIDE 0.9 % IV SOLN
500.0000 mg/m2 | Freq: Once | INTRAVENOUS | Status: AC
  Administered 2011-12-05: 920 mg via INTRAVENOUS
  Filled 2011-12-05: qty 46

## 2011-12-05 MED ORDER — HEPARIN SOD (PORK) LOCK FLUSH 100 UNIT/ML IV SOLN
500.0000 [IU] | Freq: Once | INTRAVENOUS | Status: AC | PRN
  Administered 2011-12-05: 500 [IU]
  Filled 2011-12-05: qty 5

## 2011-12-05 MED ORDER — SODIUM CHLORIDE 0.9 % IV SOLN
Freq: Once | INTRAVENOUS | Status: AC
  Administered 2011-12-05: 11:00:00 via INTRAVENOUS

## 2011-12-05 MED ORDER — EPIRUBICIN HCL CHEMO IV INJECTION 200 MG/100ML
100.0000 mg/m2 | Freq: Once | INTRAVENOUS | Status: AC
  Administered 2011-12-05: 182 mg via INTRAVENOUS
  Filled 2011-12-05: qty 91

## 2011-12-05 MED ORDER — PALONOSETRON HCL INJECTION 0.25 MG/5ML
0.2500 mg | Freq: Once | INTRAVENOUS | Status: AC
  Administered 2011-12-05: 0.25 mg via INTRAVENOUS

## 2011-12-05 MED ORDER — SODIUM CHLORIDE 0.9 % IJ SOLN
INTRAMUSCULAR | Status: AC
  Filled 2011-12-05: qty 10

## 2011-12-05 MED ORDER — SODIUM CHLORIDE 0.9 % IV SOLN
Freq: Once | INTRAVENOUS | Status: AC
  Administered 2011-12-05: 12:00:00 via INTRAVENOUS
  Filled 2011-12-05: qty 5

## 2011-12-05 MED ORDER — PALONOSETRON HCL INJECTION 0.25 MG/5ML
INTRAVENOUS | Status: AC
  Filled 2011-12-05: qty 5

## 2011-12-05 MED ORDER — SODIUM CHLORIDE 0.9 % IV SOLN: 150.0000 mg | Freq: Once | INTRAVENOUS | Status: DC

## 2011-12-05 MED ORDER — DEXAMETHASONE SODIUM PHOSPHATE 4 MG/ML IJ SOLN: 12.0000 mg | Freq: Once | INTRAMUSCULAR | Status: DC

## 2011-12-05 MED ORDER — FLUOROURACIL CHEMO INJECTION 2.5 GM/50ML
500.0000 mg/m2 | Freq: Once | INTRAVENOUS | Status: AC
  Administered 2011-12-05: 900 mg via INTRAVENOUS
  Filled 2011-12-05: qty 18

## 2011-12-05 MED ORDER — SODIUM CHLORIDE 0.9 % IJ SOLN
10.0000 mL | INTRAMUSCULAR | Status: DC | PRN
  Administered 2011-12-05: 10 mL
  Filled 2011-12-05: qty 10

## 2011-12-05 NOTE — Progress Notes (Signed)
Tolerated chemo well. 

## 2011-12-06 ENCOUNTER — Encounter (HOSPITAL_BASED_OUTPATIENT_CLINIC_OR_DEPARTMENT_OTHER): Payer: Medicaid Other

## 2011-12-06 ENCOUNTER — Other Ambulatory Visit: Payer: Self-pay | Admitting: Certified Registered Nurse Anesthetist

## 2011-12-06 VITALS — BP 112/59 | HR 86

## 2011-12-06 DIAGNOSIS — C50919 Malignant neoplasm of unspecified site of unspecified female breast: Secondary | ICD-10-CM

## 2011-12-06 MED ORDER — PEGFILGRASTIM INJECTION 6 MG/0.6ML
6.0000 mg | Freq: Once | SUBCUTANEOUS | Status: AC
  Administered 2011-12-06: 6 mg via SUBCUTANEOUS

## 2011-12-06 MED ORDER — PEGFILGRASTIM INJECTION 6 MG/0.6ML
SUBCUTANEOUS | Status: AC
  Filled 2011-12-06: qty 0.6

## 2011-12-06 NOTE — Progress Notes (Signed)
Tolerated injection well. 

## 2011-12-07 ENCOUNTER — Other Ambulatory Visit: Payer: Self-pay | Admitting: Certified Registered Nurse Anesthetist

## 2011-12-13 ENCOUNTER — Encounter (HOSPITAL_BASED_OUTPATIENT_CLINIC_OR_DEPARTMENT_OTHER): Payer: Medicaid Other | Admitting: Oncology

## 2011-12-13 VITALS — BP 142/75 | HR 79 | Temp 97.6°F | Wt 146.4 lb

## 2011-12-13 DIAGNOSIS — C50919 Malignant neoplasm of unspecified site of unspecified female breast: Secondary | ICD-10-CM

## 2011-12-13 NOTE — Progress Notes (Signed)
Problem #1 advanced local regional breast cancer on the left with what appears to be possible adherence to the pectoralis fascia. She has had 2 cycles of dose dense FEC complicated by dizziness for about 12 hours after each treatment and nausea but no vomiting. She has cut her hair off but still has some present. She is doing very well I she states. She has a little scratchy throat but there is nothing visual within the throat this red her ulcerated etc. Her left breast still shows a 3 cm Y. by prostate 5 cm craniocaudad lesion that's very mobile presently not fixed to the chest wall and not associated with adenopathy in the axilla or supraclavicular or infraclavicular areas on the left. There no nodes elsewhere. So she looks very good we'll continue with 6 cycles of FEC dose dense followed by dose dense Taxotere x4 cycles  She will gargle with her Biotene and saw and warm water more often

## 2011-12-13 NOTE — Patient Instructions (Signed)
[facility]  Discharge Instructions  RECOMMENDATIONS MADE BY THE CONSULTANT AND ANY TEST RESULTS WILL BE SENT TO YOUR REFERRING DOCTOR.   You look Great! Chemotherapy is going as planned. We will keep going with our current plan.  Salt water gargles or Biotene 4-5 times daily. Let us know if your throat gets worse. Return to see doctor in 4 weeks.   I acknowledge that I have been informed and understand all the instructions given to me and received a copy. I do not have any more questions at this time, but understand that I may call the Specialty Clinic at [facility] at [phone] during business hours should I have any further questions or need assistance in obtaining follow-up care.    __________________________________________  _____________  __________ Signature of Patient or Authorized Representative            Date                   Time    __________________________________________ Nurse's Signature

## 2011-12-19 ENCOUNTER — Encounter (HOSPITAL_BASED_OUTPATIENT_CLINIC_OR_DEPARTMENT_OTHER): Payer: Medicaid Other

## 2011-12-19 ENCOUNTER — Encounter (HOSPITAL_COMMUNITY): Payer: Medicaid Other

## 2011-12-19 VITALS — BP 120/71 | HR 86 | Temp 97.9°F | Wt 149.2 lb

## 2011-12-19 DIAGNOSIS — Z5111 Encounter for antineoplastic chemotherapy: Secondary | ICD-10-CM

## 2011-12-19 DIAGNOSIS — C50919 Malignant neoplasm of unspecified site of unspecified female breast: Secondary | ICD-10-CM

## 2011-12-19 LAB — COMPREHENSIVE METABOLIC PANEL
CO2: 29 mEq/L (ref 19–32)
Calcium: 9.9 mg/dL (ref 8.4–10.5)
Creatinine, Ser: 0.7 mg/dL (ref 0.50–1.10)
GFR calc Af Amer: >90 mL/min (ref >90)
GFR calc non Af Amer: >90 mL/min (ref >90)
Glucose, Bld: 89 mg/dL (ref 70–99)

## 2011-12-19 LAB — CBC
MCH: 30.7 pg (ref 26.0–34.0)
MCV: 91.6 fL (ref 78.0–100.0)
Platelets: 208 10*3/uL (ref 150–400)
RBC: 3.91 MIL/uL (ref 3.87–5.11)

## 2011-12-19 LAB — DIFFERENTIAL
Eosinophils Absolute: 0 10*3/uL (ref 0.0–0.7)
Eosinophils Relative: 0 % (ref 0–5)
Lymphs Abs: 1.4 10*3/uL (ref 0.7–4.0)
Monocytes Relative: 8 % (ref 3–12)

## 2011-12-19 MED ORDER — SODIUM CHLORIDE 0.9 % IV SOLN
Freq: Once | INTRAVENOUS | Status: AC
  Administered 2011-12-19: 11:00:00 via INTRAVENOUS

## 2011-12-19 MED ORDER — EPIRUBICIN HCL CHEMO IV INJECTION 200 MG/100ML
100.0000 mg/m2 | Freq: Once | INTRAVENOUS | Status: AC
  Administered 2011-12-19: 182 mg via INTRAVENOUS
  Filled 2011-12-19: qty 91

## 2011-12-19 MED ORDER — HEPARIN SOD (PORK) LOCK FLUSH 100 UNIT/ML IV SOLN
INTRAVENOUS | Status: AC
  Filled 2011-12-19: qty 5

## 2011-12-19 MED ORDER — HEPARIN SOD (PORK) LOCK FLUSH 100 UNIT/ML IV SOLN
500.0000 [IU] | Freq: Once | INTRAVENOUS | Status: AC | PRN
  Administered 2011-12-19: 500 [IU]
  Filled 2011-12-19: qty 5

## 2011-12-19 MED ORDER — SODIUM CHLORIDE 0.9 % IV SOLN
500.0000 mg/m2 | Freq: Once | INTRAVENOUS | Status: AC
  Administered 2011-12-19: 920 mg via INTRAVENOUS
  Filled 2011-12-19: qty 46

## 2011-12-19 MED ORDER — PALONOSETRON HCL INJECTION 0.25 MG/5ML
INTRAVENOUS | Status: AC
  Filled 2011-12-19: qty 5

## 2011-12-19 MED ORDER — FLUOROURACIL CHEMO INJECTION 2.5 GM/50ML
500.0000 mg/m2 | Freq: Once | INTRAVENOUS | Status: AC
  Administered 2011-12-19: 900 mg via INTRAVENOUS
  Filled 2011-12-19: qty 18

## 2011-12-19 MED ORDER — PALONOSETRON HCL INJECTION 0.25 MG/5ML
0.2500 mg | Freq: Once | INTRAVENOUS | Status: AC
  Administered 2011-12-19: 0.25 mg via INTRAVENOUS

## 2011-12-19 MED ORDER — SODIUM CHLORIDE 0.9 % IV SOLN
Freq: Once | INTRAVENOUS | Status: AC
  Administered 2011-12-19: 11:00:00 via INTRAVENOUS
  Filled 2011-12-19: qty 5

## 2011-12-19 MED ORDER — DEXAMETHASONE SODIUM PHOSPHATE 4 MG/ML IJ SOLN: 12.0000 mg | Freq: Once | INTRAMUSCULAR | Status: DC

## 2011-12-19 MED ORDER — SODIUM CHLORIDE 0.9 % IV SOLN: 150.0000 mg | Freq: Once | INTRAVENOUS | Status: DC

## 2011-12-20 ENCOUNTER — Encounter (HOSPITAL_BASED_OUTPATIENT_CLINIC_OR_DEPARTMENT_OTHER): Payer: Medicaid Other

## 2011-12-20 VITALS — BP 108/70 | HR 94 | Temp 98.3°F

## 2011-12-20 DIAGNOSIS — C50919 Malignant neoplasm of unspecified site of unspecified female breast: Secondary | ICD-10-CM

## 2011-12-20 DIAGNOSIS — Z5189 Encounter for other specified aftercare: Secondary | ICD-10-CM

## 2011-12-20 MED ORDER — PEGFILGRASTIM INJECTION 6 MG/0.6ML
6.0000 mg | Freq: Once | SUBCUTANEOUS | Status: AC
  Administered 2011-12-20: 6 mg via SUBCUTANEOUS

## 2011-12-20 MED ORDER — PEGFILGRASTIM INJECTION 6 MG/0.6ML
SUBCUTANEOUS | Status: AC
  Filled 2011-12-20: qty 0.6

## 2011-12-20 NOTE — Progress Notes (Signed)
Bethany Michael presents today for injection per MD orders. Neulasta 6mg administered SQ in right Abdomen. Administration without incident. Patient tolerated well.  

## 2011-12-30 ENCOUNTER — Other Ambulatory Visit (HOSPITAL_COMMUNITY): Payer: Self-pay | Admitting: Oncology

## 2011-12-30 ENCOUNTER — Encounter (HOSPITAL_COMMUNITY): Payer: Medicaid Other | Attending: Oncology

## 2011-12-30 DIAGNOSIS — Z006 Encounter for examination for normal comparison and control in clinical research program: Secondary | ICD-10-CM | POA: Insufficient documentation

## 2011-12-30 DIAGNOSIS — C50919 Malignant neoplasm of unspecified site of unspecified female breast: Secondary | ICD-10-CM

## 2011-12-30 LAB — COMPREHENSIVE METABOLIC PANEL
AST: 11 U/L (ref 0–37)
CO2: 28 mEq/L (ref 19–32)
Calcium: 9.7 mg/dL (ref 8.4–10.5)
Creatinine, Ser: 0.6 mg/dL (ref 0.50–1.10)
GFR calc non Af Amer: >90 mL/min (ref >90)

## 2011-12-30 LAB — DIFFERENTIAL
Basophils Absolute: 0.1 10*3/uL (ref 0.0–0.1)
Lymphs Abs: 1 10*3/uL (ref 0.7–4.0)
Monocytes Absolute: 0.4 10*3/uL (ref 0.1–1.0)

## 2011-12-30 LAB — CBC
MCH: 31.1 pg (ref 26.0–34.0)
MCV: 91.5 fL (ref 78.0–100.0)
Platelets: 205 10*3/uL (ref 150–400)
RDW: 14 % (ref 11.5–15.5)

## 2011-12-30 MED ORDER — SODIUM CHLORIDE 0.9 % IJ SOLN
10.0000 mL | Freq: Once | INTRAMUSCULAR | Status: AC
  Administered 2011-12-30: 10 mL via INTRAVENOUS
  Filled 2011-12-30: qty 10

## 2011-12-30 MED ORDER — SODIUM CHLORIDE 0.9 % IJ SOLN
INTRAMUSCULAR | Status: AC
  Filled 2011-12-30: qty 10

## 2011-12-30 MED ORDER — HEPARIN SOD (PORK) LOCK FLUSH 100 UNIT/ML IV SOLN
500.0000 [IU] | Freq: Once | INTRAVENOUS | Status: AC
  Administered 2011-12-30: 500 [IU] via INTRAVENOUS
  Filled 2011-12-30: qty 5

## 2011-12-30 MED ORDER — HEPARIN SOD (PORK) LOCK FLUSH 100 UNIT/ML IV SOLN
INTRAVENOUS | Status: AC
  Filled 2011-12-30: qty 5

## 2011-12-30 NOTE — Progress Notes (Signed)
Bethany Michael presented for Portacath access and flush. Proper placement of portacath confirmed by CXR. Portacath located rt chest wall accessed with  H 20 needle. Good blood return present. Portacath flushed with 20ml NS and 500U/14ml Heparin and needle removed intact. Procedure without incident. Patient tolerated procedure well.

## 2011-12-30 NOTE — Progress Notes (Signed)
Quick Note:  Message left for patient on her # provided that her ANC was too low for chemo as of today but to come in on Monday and we will repeat the CBC diff to see if her WBCs have increased to an acceptable level for chemo. Also instructed patient to stay out of the soil/dirt, no fresh flowers, no fresh fruits or vegetables needed to be consumed, no changing the kitty litter if they had cats, and to avoid crowds, church, and people that she knows is sick. Use alcohol hand gel and handwash. Also, told pt that if she developed fevers/chills over the w/e to go to the ER. Again, I told her to still come in at 930 on Monday for labs to see if her WBC count had come up. ______

## 2012-01-02 ENCOUNTER — Encounter (HOSPITAL_BASED_OUTPATIENT_CLINIC_OR_DEPARTMENT_OTHER): Payer: Medicaid Other

## 2012-01-02 DIAGNOSIS — Z5111 Encounter for antineoplastic chemotherapy: Secondary | ICD-10-CM

## 2012-01-02 DIAGNOSIS — C50919 Malignant neoplasm of unspecified site of unspecified female breast: Secondary | ICD-10-CM

## 2012-01-02 LAB — CBC
HCT: 34.6 % — ABNORMAL LOW (ref 36.0–46.0)
Hemoglobin: 11.5 g/dL — ABNORMAL LOW (ref 12.0–15.0)
MCHC: 33.2 g/dL (ref 30.0–36.0)
RBC: 3.73 MIL/uL — ABNORMAL LOW (ref 3.87–5.11)
WBC: 5 10*3/uL (ref 4.0–10.5)

## 2012-01-02 LAB — DIFFERENTIAL
Lymphocytes Relative: 26 % (ref 12–46)
Lymphs Abs: 1.3 10*3/uL (ref 0.7–4.0)
Monocytes Absolute: 0.5 10*3/uL (ref 0.1–1.0)
Monocytes Relative: 10 % (ref 3–12)
Neutro Abs: 3.1 10*3/uL (ref 1.7–7.7)
Neutrophils Relative %: 61 % (ref 43–77)

## 2012-01-02 MED ORDER — DEXAMETHASONE SODIUM PHOSPHATE 4 MG/ML IJ SOLN: 12.0000 mg | Freq: Once | INTRAMUSCULAR | Status: DC

## 2012-01-02 MED ORDER — PALONOSETRON HCL INJECTION 0.25 MG/5ML
INTRAVENOUS | Status: AC
  Filled 2012-01-02: qty 5

## 2012-01-02 MED ORDER — PALONOSETRON HCL INJECTION 0.25 MG/5ML
0.2500 mg | Freq: Once | INTRAVENOUS | Status: AC
  Administered 2012-01-02: 0.25 mg via INTRAVENOUS

## 2012-01-02 MED ORDER — FLUOROURACIL CHEMO INJECTION 2.5 GM/50ML
500.0000 mg/m2 | Freq: Once | INTRAVENOUS | Status: AC
  Administered 2012-01-02: 900 mg via INTRAVENOUS
  Filled 2012-01-02: qty 18

## 2012-01-02 MED ORDER — EPIRUBICIN HCL CHEMO IV INJECTION 200 MG/100ML
100.0000 mg/m2 | Freq: Once | INTRAVENOUS | Status: AC
  Administered 2012-01-02: 182 mg via INTRAVENOUS
  Filled 2012-01-02: qty 91

## 2012-01-02 MED ORDER — SODIUM CHLORIDE 0.9 % IJ SOLN
INTRAMUSCULAR | Status: AC
  Filled 2012-01-02: qty 10

## 2012-01-02 MED ORDER — SODIUM CHLORIDE 0.9 % IV SOLN
Freq: Once | INTRAVENOUS | Status: AC
  Administered 2012-01-02: 11:00:00 via INTRAVENOUS

## 2012-01-02 MED ORDER — SODIUM CHLORIDE 0.9 % IV SOLN
Freq: Once | INTRAVENOUS | Status: AC
  Administered 2012-01-02: 11:00:00 via INTRAVENOUS
  Filled 2012-01-02: qty 5

## 2012-01-02 MED ORDER — HEPARIN SOD (PORK) LOCK FLUSH 100 UNIT/ML IV SOLN
500.0000 [IU] | Freq: Once | INTRAVENOUS | Status: AC | PRN
  Administered 2012-01-02: 500 [IU]
  Filled 2012-01-02: qty 5

## 2012-01-02 MED ORDER — CYCLOPHOSPHAMIDE CHEMO INJECTION 1 GM
500.0000 mg/m2 | Freq: Once | INTRAMUSCULAR | Status: AC
  Administered 2012-01-02: 920 mg via INTRAVENOUS
  Filled 2012-01-02: qty 46

## 2012-01-02 MED ORDER — HEPARIN SOD (PORK) LOCK FLUSH 100 UNIT/ML IV SOLN
INTRAVENOUS | Status: AC
  Filled 2012-01-02: qty 5

## 2012-01-02 MED ORDER — SODIUM CHLORIDE 0.9 % IV SOLN: 150.0000 mg | Freq: Once | INTRAVENOUS | Status: DC

## 2012-01-03 ENCOUNTER — Encounter (HOSPITAL_BASED_OUTPATIENT_CLINIC_OR_DEPARTMENT_OTHER): Payer: Medicaid Other

## 2012-01-03 VITALS — BP 96/65 | HR 72 | Temp 97.6°F

## 2012-01-03 DIAGNOSIS — C50919 Malignant neoplasm of unspecified site of unspecified female breast: Secondary | ICD-10-CM

## 2012-01-03 MED ORDER — PEGFILGRASTIM INJECTION 6 MG/0.6ML
SUBCUTANEOUS | Status: AC
  Filled 2012-01-03: qty 0.6

## 2012-01-03 MED ORDER — PEGFILGRASTIM INJECTION 6 MG/0.6ML
6.0000 mg | Freq: Once | SUBCUTANEOUS | Status: AC
  Administered 2012-01-03: 6 mg via SUBCUTANEOUS

## 2012-01-03 NOTE — Progress Notes (Signed)
Bethany Michael presents today for injection per MD orders. Neulasta 6mg administered SQ in right Abdomen. Administration without incident. Patient tolerated well.  

## 2012-01-10 ENCOUNTER — Ambulatory Visit (HOSPITAL_COMMUNITY): Payer: Medicaid Other | Admitting: Oncology

## 2012-01-10 ENCOUNTER — Encounter (HOSPITAL_COMMUNITY): Payer: Self-pay | Admitting: Oncology

## 2012-01-13 ENCOUNTER — Encounter (HOSPITAL_BASED_OUTPATIENT_CLINIC_OR_DEPARTMENT_OTHER): Payer: Medicaid Other

## 2012-01-13 ENCOUNTER — Other Ambulatory Visit (HOSPITAL_COMMUNITY): Payer: Medicaid Other

## 2012-01-13 DIAGNOSIS — C50919 Malignant neoplasm of unspecified site of unspecified female breast: Secondary | ICD-10-CM

## 2012-01-13 LAB — COMPREHENSIVE METABOLIC PANEL
BUN: 16 mg/dL (ref 6–23)
CO2: 27 mEq/L (ref 19–32)
Calcium: 9.8 mg/dL (ref 8.4–10.5)
Creatinine, Ser: 0.59 mg/dL (ref 0.50–1.10)
GFR calc Af Amer: >90 mL/min (ref >90)
GFR calc non Af Amer: >90 mL/min (ref >90)
Glucose, Bld: 106 mg/dL — ABNORMAL HIGH (ref 70–99)

## 2012-01-13 LAB — DIFFERENTIAL
Eosinophils Relative: 0 % (ref 0–5)
Lymphocytes Relative: 27 % (ref 12–46)
Lymphs Abs: 1 10*3/uL (ref 0.7–4.0)
Monocytes Absolute: 0.4 10*3/uL (ref 0.1–1.0)

## 2012-01-13 LAB — CBC
HCT: 32.1 % — ABNORMAL LOW (ref 36.0–46.0)
MCH: 31.8 pg (ref 26.0–34.0)
MCV: 92 fL (ref 78.0–100.0)
RBC: 3.49 MIL/uL — ABNORMAL LOW (ref 3.87–5.11)
WBC: 3.7 10*3/uL — ABNORMAL LOW (ref 4.0–10.5)

## 2012-01-13 MED ORDER — SODIUM CHLORIDE 0.9 % IJ SOLN
INTRAMUSCULAR | Status: AC
  Filled 2012-01-13: qty 10

## 2012-01-13 MED ORDER — HEPARIN SOD (PORK) LOCK FLUSH 100 UNIT/ML IV SOLN
INTRAVENOUS | Status: AC
  Filled 2012-01-13: qty 5

## 2012-01-13 NOTE — Progress Notes (Signed)
Tolerated port flush well.  Good blood return. 

## 2012-01-16 ENCOUNTER — Encounter (HOSPITAL_BASED_OUTPATIENT_CLINIC_OR_DEPARTMENT_OTHER): Payer: Medicaid Other

## 2012-01-16 VITALS — BP 116/54 | HR 79 | Temp 97.5°F | Resp 16

## 2012-01-16 DIAGNOSIS — C50919 Malignant neoplasm of unspecified site of unspecified female breast: Secondary | ICD-10-CM

## 2012-01-16 DIAGNOSIS — Z5111 Encounter for antineoplastic chemotherapy: Secondary | ICD-10-CM

## 2012-01-16 MED ORDER — SODIUM CHLORIDE 0.9 % IV SOLN: 150.0000 mg | Freq: Once | INTRAVENOUS | Status: DC

## 2012-01-16 MED ORDER — FLUOROURACIL CHEMO INJECTION 2.5 GM/50ML
500.0000 mg/m2 | Freq: Once | INTRAVENOUS | Status: AC
  Administered 2012-01-16: 900 mg via INTRAVENOUS
  Filled 2012-01-16: qty 18

## 2012-01-16 MED ORDER — PALONOSETRON HCL INJECTION 0.25 MG/5ML
0.2500 mg | Freq: Once | INTRAVENOUS | Status: AC
  Administered 2012-01-16: 0.25 mg via INTRAVENOUS

## 2012-01-16 MED ORDER — DEXAMETHASONE SODIUM PHOSPHATE 4 MG/ML IJ SOLN
12.0000 mg | Freq: Once | INTRAMUSCULAR | Status: DC
Start: 2012-01-16 — End: 2012-01-16

## 2012-01-16 MED ORDER — SODIUM CHLORIDE 0.9 % IJ SOLN
10.0000 mL | INTRAMUSCULAR | Status: DC | PRN
  Administered 2012-01-16: 10 mL
  Filled 2012-01-16: qty 10

## 2012-01-16 MED ORDER — PALONOSETRON HCL INJECTION 0.25 MG/5ML
INTRAVENOUS | Status: AC
  Filled 2012-01-16: qty 5

## 2012-01-16 MED ORDER — EPIRUBICIN HCL CHEMO IV INJECTION 200 MG/100ML
100.0000 mg/m2 | Freq: Once | INTRAVENOUS | Status: AC
  Administered 2012-01-16: 182 mg via INTRAVENOUS
  Filled 2012-01-16: qty 91

## 2012-01-16 MED ORDER — SODIUM CHLORIDE 0.9 % IJ SOLN
INTRAMUSCULAR | Status: AC
  Filled 2012-01-16: qty 10

## 2012-01-16 MED ORDER — SODIUM CHLORIDE 0.9 % IV SOLN
500.0000 mg/m2 | Freq: Once | INTRAVENOUS | Status: AC
  Administered 2012-01-16: 920 mg via INTRAVENOUS
  Filled 2012-01-16: qty 46

## 2012-01-16 MED ORDER — SODIUM CHLORIDE 0.9 % IV SOLN
Freq: Once | INTRAVENOUS | Status: AC
  Administered 2012-01-16: 12:00:00 via INTRAVENOUS
  Filled 2012-01-16: qty 5

## 2012-01-16 MED ORDER — SODIUM CHLORIDE 0.9 % IV SOLN
Freq: Once | INTRAVENOUS | Status: AC
  Administered 2012-01-16: 11:00:00 via INTRAVENOUS

## 2012-01-16 MED ORDER — HEPARIN SOD (PORK) LOCK FLUSH 100 UNIT/ML IV SOLN
500.0000 [IU] | Freq: Once | INTRAVENOUS | Status: AC | PRN
  Administered 2012-01-16: 500 [IU]
  Filled 2012-01-16: qty 5

## 2012-01-16 NOTE — Progress Notes (Signed)
Tolerated chemo well. 

## 2012-01-17 ENCOUNTER — Encounter (HOSPITAL_BASED_OUTPATIENT_CLINIC_OR_DEPARTMENT_OTHER): Payer: Medicaid Other | Admitting: Oncology

## 2012-01-17 ENCOUNTER — Encounter (HOSPITAL_COMMUNITY): Payer: Medicaid Other

## 2012-01-17 ENCOUNTER — Encounter (HOSPITAL_COMMUNITY): Payer: Self-pay | Admitting: Oncology

## 2012-01-17 ENCOUNTER — Ambulatory Visit (HOSPITAL_COMMUNITY): Payer: Medicaid Other | Admitting: Oncology

## 2012-01-17 VITALS — BP 111/57 | HR 94 | Temp 98.1°F | Resp 16 | Wt 152.4 lb

## 2012-01-17 DIAGNOSIS — C50419 Malignant neoplasm of upper-outer quadrant of unspecified female breast: Secondary | ICD-10-CM

## 2012-01-17 DIAGNOSIS — C50919 Malignant neoplasm of unspecified site of unspecified female breast: Secondary | ICD-10-CM

## 2012-01-17 MED ORDER — PEGFILGRASTIM INJECTION 6 MG/0.6ML
6.0000 mg | Freq: Once | SUBCUTANEOUS | Status: AC
  Administered 2012-01-17: 6 mg via SUBCUTANEOUS

## 2012-01-17 MED ORDER — PEGFILGRASTIM INJECTION 6 MG/0.6ML
SUBCUTANEOUS | Status: AC
  Filled 2012-01-17: qty 0.6

## 2012-01-17 NOTE — Patient Instructions (Addendum)
Bethany Michael  DOB 1961-01-16 CSN [id number]  MRN [id number] Dr. Glenford Peers   [facility]  Discharge Instructions  RECOMMENDATIONS MADE BY THE CONSULTANT AND ANY TEST RESULTS WILL BE SENT TO YOUR REFERRING DOCTOR.   EXAM FINDINGS BY MD TODAY AND SIGNS AND SYMPTOMS TO REPORT TO CLINIC OR PRIMARY MD: You are doing well.  Report any new lumps, bone pain, shortness of breath, fevers, uncontrolled nausea or vomiting, etc.  MEDICATIONS PRESCRIBED: none       SPECIAL INSTRUCTIONS/FOLLOW-UP: Return to Clinic as scheduled for chemotherapy and to be seen in follow-up after 1st cycle of chemotherapy using taxotere.   I acknowledge that I have been informed and understand all the instructions given to me and received a copy. I do not have any more questions at this time, but understand that I may call the Specialty Clinic at [facility] at [phone] during business hours should I have any further questions or need assistance in obtaining follow-up care.    __________________________________________  _____________  __________ Signature of Patient or Authorized Representative            Date                   Time    __________________________________________ Nurse's Signature

## 2012-01-17 NOTE — Progress Notes (Signed)
Bethany Michael presents today for injection per MD orders. Neulasta 6mg  administered SQ in right Abdomen. Administration without incident. Patient tolerated well.

## 2012-01-17 NOTE — Progress Notes (Signed)
Harlow Asa, MD [address] [place] [address]  1. Breast cancer-left breast  sennosides-docusate sodium (SENOKOT-S) 8.6-50 MG tablet, 2D Echocardiogram without contrast, CBC with Differential, Comprehensive metabolic panel, CBC with Differential, Comprehensive metabolic panel, CBC with Differential, Comprehensive metabolic panel    CURRENT THERAPY:S/P 5 cycles of dose dense FEC. Cycle 6 is scheduled for 9/3.  She will complete 6 cycles of FEC dose dense followed by Taxol x 4   INTERVAL HISTORY: [name] 51 y.o. 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   Kambrie is doing well.  She reports that her breast mass is shrinking in size. She denies any nausea or vomiting.  She remains very active without any complaints.  She reports only some indigestion which I have offered her Omeprazole, Protonix, or dexilant.  She is not interested in any more pills.  So I informed her of OTC Prilosec, Mylanta, Maalox, Papaya extract, and aloe vera juice if she is interested.   She asks if chemotherapy will help with Kidney stones and I informed her that it will not.    We spoke about her next chemotherapy regimen following her 6th cycle of FEC which is Taxol.  We discussed myalgias, arthralgias, bone pain, and peripheral neuropathy.  She will inform us of any issues.  She will keep [place] in the loop on peripheral neuropathy.  We will see her back after her 1st cycle of Taxol in 1 month.  She will call us if she wishes to have help for her indigestion.   Otherwise, she denies any complaints.    Past Medical History  Diagnosis Date  . Cancer     breast    has Breast cancer-left breast on her problem list.      has no known allergies.  We administered pegfilgrastim.  Past Surgical History  Procedure Date  . Cesarean section     3 previous  . Portacath  placement 11/17/2011    Procedure: INSERTION PORT-A-CATH;  Surgeon: Adolph Pollack, MD;  Location: [facility];  Service: General;  Laterality: N/A;  ultrasound guided Portacath insertion right side    Denies any headaches, dizziness, double vision, fevers, chills, night sweats, nausea, vomiting, diarrhea, constipation, chest pain, heart palpitations, shortness of breath, blood in stool, black tarry stool, urinary pain, urinary burning, urinary frequency, hematuria.   PHYSICAL EXAMINATION  ECOG PERFORMANCE STATUS: 1 - Symptomatic but completely ambulatory  Filed Vitals:   01/17/12 1536  BP: 50/57  Pulse: 94  Temp: 98.1 F (36.7 C)  Resp: 16    GENERAL:alert, no distress, well nourished, well developed, comfortable, cooperative and smiling SKIN: skin color, texture, turgor are normal, no rashes or significant lesions HEAD: Normocephalic, No masses, lesions, tenderness or abnormalities EYES: normal, Conjunctiva are pink and non-injected EARS: External ears normal OROPHARYNX:lips, buccal mucosa, and tongue normal and mucous membranes are moist  NECK: supple, no adenopathy, no stridor, non-tender, trachea midline LYMPH:  no palpable lymphadenopathy, no hepatosplenomegaly BREAST:right breast normal without mass, skin or nipple changes or axillary nodes, left mass is much more pliable and less hard measuring 2 x 5 cm deep the the areola and nipple is normalizing and not nearly as distorted.  LUNGS: clear to auscultation and percussion HEART: regular rate & rhythm, no murmurs, no gallops, S1 normal and S2 normal ABDOMEN:abdomen soft, non-tender, normal bowel sounds,  no masses or organomegaly and no hepatosplenomegaly BACK: Back symmetric, no curvature. EXTREMITIES:less then 2 second capillary refill, no joint deformities, effusion, or inflammation, no edema, no skin discoloration, no clubbing, no cyanosis  NEURO: alert & oriented x 3 with fluent speech, no focal  motor/sensory deficits, gait normal   LABORATORY DATA: CBC    Component Value Date/Time   WBC 3.7* 01/13/2012 1000   WBC 4.7 11/01/2011 0924   RBC 3.49* 01/13/2012 1000   RBC 4.18 11/01/2011 0924   HGB 11.1* 01/13/2012 1000   HGB 13.2 11/01/2011 0924   HCT 32.1* 01/13/2012 1000   HCT 39.6 11/01/2011 0924   PLT 159 01/13/2012 1000   PLT 197 11/01/2011 0924   MCV 92.0 01/13/2012 1000   MCV 94.8 11/01/2011 0924   MCH 31.8 01/13/2012 1000   MCH 31.6 11/01/2011 0924   MCHC 34.6 01/13/2012 1000   MCHC 33.4 11/01/2011 0924   RDW 15.3 01/13/2012 1000   RDW 13.9 11/01/2011 0924   LYMPHSABS 1.0 01/13/2012 1000   LYMPHSABS 1.3 11/01/2011 0924   MONOABS 0.4 01/13/2012 1000   MONOABS 0.4 11/01/2011 0924   EOSABS 0.0 01/13/2012 1000   EOSABS 0.1 11/01/2011 0924   BASOSABS 0.2* 01/13/2012 1000   BASOSABS 0.1 11/01/2011 0924      Chemistry      Component Value Date/Time   NA 137 01/13/2012 1000   K 3.7 01/13/2012 1000   CL 103 01/13/2012 1000   CO2 27 01/13/2012 1000   BUN 16 01/13/2012 1000   CREATININE 0.59 01/13/2012 1000      Component Value Date/Time   CALCIUM 9.8 01/13/2012 1000   ALKPHOS 78 01/13/2012 1000   AST 13 01/13/2012 1000   ALT 10 01/13/2012 1000   BILITOT 0.2* 01/13/2012 1000        PATHOLOGY: 10/13/2011  ADDITIONAL INFORMATION:  PROGNOSTIC INDICATORS - ACIS  Results  IMMUNOHISTOCHEMICAL AND MORPHOMETRIC ANALYSIS BY THE AUTOMATED CELLULAR  IMAGING SYSTEM (ACIS)  Estrogen Receptor (Negative, <1%): 0%, NEGATIVE  Progesterone Receptor (Negative, <1%): 0%, NEGATIVE  Proliferation Marker Ki67 by M IB-1 (Low<20%): 50%  COMMENT: The negative hormone receptor study(ies) in this case have no internal positive control.  All controls stained appropriately  Pecola Leisure MD  Pathologist, Electronic Signature  ( Signed 10/19/2011)  CHROMOGENIC IN-SITU HYBRIDIZATION  Interpretation  HER-2/NEU BY CISH - NO AMPLIFICATION OF HER-2 DETECTED. THE RATIO OF HER-2: CEP 17  SIGNALS WAS 1.13.  Reference range:    Ratio: HER2:CEP17 < 1.8 - gene amplification not observed  Ratio: HER2:CEP 17 1.8-2.2 - equivocal result  Ratio: HER2:CEP17 > 2.2 - gene amplification observed  1 of 3  FINAL for CAILYN, HOUDEK ([id number])  ADDITIONAL INFORMATION:(continued)  Pecola Leisure MD  Pathologist, Electronic Signature  ( Signed 10/18/2011)  FINAL DIAGNOSIS  Diagnosis  Breast, left, needle core biopsy, mass, 12 o'clock  -INVASIVE DUCTAL CARCINOMA, SEE COMMENT.  Microscopic Comment  Although the grade of tumor is best assessed at resection, with these biopsies, the invasive tumor is grade II.  Breast prognostic studies is pending and reported in an addendum. The case is reviewed with Dr. Raynald Blend who  concurs. (CR:mw 10-14-11)  [place] RUND DO  Pathologist, Electronic Signature  (Case signed 10/14/2011)    11/07/2011  Diagnosis  Breast, right, needle core biopsy, retroareolar/central  - RADIAL/COMPLEX SCLEROSING LESION WITH PAPILLARY ARCHITECTURE SEE COMMENT  - MICROCALCIFICATIONS PRESENT  Microscopic Comment  The myoepithelial layer is demonstrated with calponin, p63 and smooth muscle mycin immunohistochemical  stains.  Intraductal papilloma is in the differential diagnosis. Excision is recommended to further characterize  the extent and severity of the lesion. The case was reviewed with Dr Colonel Bald, who concurs. (CR:kh 11-08-11)  [place] RUND DO  Pathologist, Electronic Signature  (Case signed 11/09/2011)     ASSESSMENT:  1. 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 2. Indigestion, declined therapy   PLAN:  1. Move on with cycle 6 as scheduled. 2. 2D echo next week to evaluate EF 3. Pre-chemo lab work ordered: CBC diff, CMET 4. Recommended Maalox, Mylanta, Tums, Papaya extract, Aloe vera juice for in digestion since she has declined omeprazole, protonix, dexilant.  5. She will move on to Taxol as scheduled.  6. Neulasta injection  given today 7. Return in 4 weeks for follow-up.  All questions were answered. The patient knows to call the clinic with any problems, questions or concerns. We can certainly see the patient much sooner if necessary.  The patient and plan discussed with Glenford Peers, MD and he is in agreement with the aforementioned.  KEFALAS,THOMAS

## 2012-01-23 ENCOUNTER — Ambulatory Visit (HOSPITAL_COMMUNITY)
Admission: RE | Admit: 2012-01-23 | Discharge: 2012-01-23 | Disposition: A | Discharge status: Home / Self Care | Payer: Medicaid Other | Source: Ambulatory Visit | Attending: Oncology | Admitting: Oncology

## 2012-01-23 DIAGNOSIS — Z9221 Personal history of antineoplastic chemotherapy: Secondary | ICD-10-CM | POA: Insufficient documentation

## 2012-01-23 DIAGNOSIS — Z09 Encounter for follow-up examination after completed treatment for conditions other than malignant neoplasm: Secondary | ICD-10-CM

## 2012-01-23 DIAGNOSIS — C50919 Malignant neoplasm of unspecified site of unspecified female breast: Secondary | ICD-10-CM | POA: Insufficient documentation

## 2012-01-23 NOTE — Progress Notes (Signed)
*  PRELIMINARY RESULTS* Echocardiogram 2D Echocardiogram has been performed.  Conrad Citrus 01/23/2012, 11:40 AM

## 2012-01-27 ENCOUNTER — Encounter (HOSPITAL_BASED_OUTPATIENT_CLINIC_OR_DEPARTMENT_OTHER): Payer: Medicaid Other

## 2012-01-27 DIAGNOSIS — Z452 Encounter for adjustment and management of vascular access device: Secondary | ICD-10-CM

## 2012-01-27 DIAGNOSIS — C50919 Malignant neoplasm of unspecified site of unspecified female breast: Secondary | ICD-10-CM

## 2012-01-27 LAB — COMPREHENSIVE METABOLIC PANEL
Albumin: 4.1 g/dL (ref 3.5–5.2)
Alkaline Phosphatase: 77 U/L (ref 39–117)
BUN: 19 mg/dL (ref 6–23)
CO2: 26 mEq/L (ref 19–32)
Chloride: 105 mEq/L (ref 96–112)
GFR calc non Af Amer: >90 mL/min (ref >90)
Glucose, Bld: 131 mg/dL — ABNORMAL HIGH (ref 70–99)
Potassium: 3.5 mEq/L (ref 3.5–5.1)
Total Bilirubin: 0.2 mg/dL — ABNORMAL LOW (ref 0.3–1.2)

## 2012-01-27 LAB — CBC WITH DIFFERENTIAL/PLATELET
Basophils Relative: 4 % — ABNORMAL HIGH (ref 0–1)
HCT: 30.7 % — ABNORMAL LOW (ref 36.0–46.0)
Hemoglobin: 10.5 g/dL — ABNORMAL LOW (ref 12.0–15.0)
Lymphocytes Relative: 36 % (ref 12–46)
Lymphs Abs: 1.3 10*3/uL (ref 0.7–4.0)
Monocytes Relative: 9 % (ref 3–12)
Neutro Abs: 1.8 10*3/uL (ref 1.7–7.7)
Neutrophils Relative %: 52 % (ref 43–77)
RBC: 3.31 MIL/uL — ABNORMAL LOW (ref 3.87–5.11)
WBC: 3.6 10*3/uL — ABNORMAL LOW (ref 4.0–10.5)

## 2012-01-27 MED ORDER — HEPARIN SOD (PORK) LOCK FLUSH 100 UNIT/ML IV SOLN
500.0000 [IU] | Freq: Once | INTRAVENOUS | Status: AC
  Administered 2012-01-27: 500 [IU] via INTRAVENOUS
  Filled 2012-01-27: qty 5

## 2012-01-27 MED ORDER — SODIUM CHLORIDE 0.9 % IJ SOLN
10.0000 mL | INTRAMUSCULAR | Status: DC | PRN
  Administered 2012-01-27: 10 mL via INTRAVENOUS
  Filled 2012-01-27: qty 10

## 2012-01-27 MED ORDER — HEPARIN SOD (PORK) LOCK FLUSH 100 UNIT/ML IV SOLN
INTRAVENOUS | Status: AC
  Filled 2012-01-27: qty 5

## 2012-01-27 MED ORDER — SODIUM CHLORIDE 0.9 % IJ SOLN
INTRAMUSCULAR | Status: AC
  Filled 2012-01-27: qty 20

## 2012-01-27 NOTE — Progress Notes (Signed)
Bethany Michael presented for Portacath access and flush. Proper placement of portacath confirmed by CXR. Portacath located right chest wall accessed with  H 20 needle. Good blood return present.  Specimen drawn for labs. Portacath flushed with 20ml NS and 500U/73ml Heparin and needle removed intact. Procedure without incident. Patient tolerated procedure well.

## 2012-01-31 ENCOUNTER — Encounter (HOSPITAL_COMMUNITY): Payer: Medicaid Other | Attending: Oncology

## 2012-01-31 ENCOUNTER — Other Ambulatory Visit: Payer: Self-pay | Admitting: Certified Registered Nurse Anesthetist

## 2012-01-31 VITALS — BP 85/49 | HR 73 | Temp 97.6°F | Resp 16 | Wt 151.8 lb

## 2012-01-31 DIAGNOSIS — Z5111 Encounter for antineoplastic chemotherapy: Secondary | ICD-10-CM

## 2012-01-31 DIAGNOSIS — C50919 Malignant neoplasm of unspecified site of unspecified female breast: Secondary | ICD-10-CM

## 2012-01-31 DIAGNOSIS — R110 Nausea: Secondary | ICD-10-CM | POA: Insufficient documentation

## 2012-01-31 MED ORDER — PALONOSETRON HCL INJECTION 0.25 MG/5ML
0.2500 mg | Freq: Once | INTRAVENOUS | Status: AC
  Administered 2012-01-31: 0.25 mg via INTRAVENOUS

## 2012-01-31 MED ORDER — SODIUM CHLORIDE 0.9 % IV SOLN: 150.0000 mg | Freq: Once | INTRAVENOUS | Status: DC

## 2012-01-31 MED ORDER — SODIUM CHLORIDE 0.9 % IJ SOLN
10.0000 mL | INTRAMUSCULAR | Status: DC | PRN
  Filled 2012-01-31: qty 10

## 2012-01-31 MED ORDER — SODIUM CHLORIDE 0.9 % IV SOLN
Freq: Once | INTRAVENOUS | Status: AC
  Administered 2012-01-31: 10:00:00 via INTRAVENOUS
  Filled 2012-01-31: qty 5

## 2012-01-31 MED ORDER — FLUOROURACIL CHEMO INJECTION 2.5 GM/50ML
500.0000 mg/m2 | Freq: Once | INTRAVENOUS | Status: AC
  Administered 2012-01-31: 900 mg via INTRAVENOUS
  Filled 2012-01-31: qty 18

## 2012-01-31 MED ORDER — PALONOSETRON HCL INJECTION 0.25 MG/5ML
INTRAVENOUS | Status: AC
  Filled 2012-01-31: qty 5

## 2012-01-31 MED ORDER — EPIRUBICIN HCL CHEMO IV INJECTION 200 MG/100ML
100.0000 mg/m2 | Freq: Once | INTRAVENOUS | Status: AC
  Administered 2012-01-31: 182 mg via INTRAVENOUS
  Filled 2012-01-31: qty 91

## 2012-01-31 MED ORDER — HEPARIN SOD (PORK) LOCK FLUSH 100 UNIT/ML IV SOLN
INTRAVENOUS | Status: AC
  Filled 2012-01-31: qty 5

## 2012-01-31 MED ORDER — DEXAMETHASONE SODIUM PHOSPHATE 4 MG/ML IJ SOLN: 12.0000 mg | Freq: Once | INTRAMUSCULAR | Status: DC

## 2012-01-31 MED ORDER — HEPARIN SOD (PORK) LOCK FLUSH 100 UNIT/ML IV SOLN
500.0000 [IU] | Freq: Once | INTRAVENOUS | Status: AC | PRN
  Administered 2012-01-31: 500 [IU]
  Filled 2012-01-31: qty 5

## 2012-01-31 MED ORDER — SODIUM CHLORIDE 0.9 % IV SOLN
Freq: Once | INTRAVENOUS | Status: AC
  Administered 2012-01-31: 10:00:00 via INTRAVENOUS

## 2012-01-31 MED ORDER — SODIUM CHLORIDE 0.9 % IV SOLN
500.0000 mg/m2 | Freq: Once | INTRAVENOUS | Status: AC
  Administered 2012-01-31: 920 mg via INTRAVENOUS
  Filled 2012-01-31: qty 46

## 2012-02-01 ENCOUNTER — Encounter (HOSPITAL_BASED_OUTPATIENT_CLINIC_OR_DEPARTMENT_OTHER): Payer: Medicaid Other

## 2012-02-01 VITALS — BP 96/61 | HR 118

## 2012-02-01 DIAGNOSIS — C50919 Malignant neoplasm of unspecified site of unspecified female breast: Secondary | ICD-10-CM

## 2012-02-01 DIAGNOSIS — Z5189 Encounter for other specified aftercare: Secondary | ICD-10-CM

## 2012-02-01 MED ORDER — PEGFILGRASTIM INJECTION 6 MG/0.6ML
6.0000 mg | Freq: Once | SUBCUTANEOUS | Status: AC
  Administered 2012-02-01: 6 mg via SUBCUTANEOUS

## 2012-02-01 MED ORDER — PEGFILGRASTIM INJECTION 6 MG/0.6ML
SUBCUTANEOUS | Status: AC
Start: 2012-02-01 — End: 2012-02-01
  Filled 2012-02-01: qty 0.6

## 2012-02-01 NOTE — Progress Notes (Signed)
Tolerated injection well. 

## 2012-02-17 ENCOUNTER — Encounter (HOSPITAL_BASED_OUTPATIENT_CLINIC_OR_DEPARTMENT_OTHER): Payer: Medicaid Other | Admitting: Oncology

## 2012-02-17 VITALS — BP 99/38 | HR 74 | Temp 97.7°F | Resp 14 | Wt 149.6 lb

## 2012-02-17 DIAGNOSIS — C50919 Malignant neoplasm of unspecified site of unspecified female breast: Secondary | ICD-10-CM

## 2012-02-17 LAB — CBC WITH DIFFERENTIAL/PLATELET
Eosinophils Absolute: 0 10*3/uL (ref 0.0–0.7)
Eosinophils Relative: 0 % (ref 0–5)
HCT: 33.2 % — ABNORMAL LOW (ref 36.0–46.0)
Hemoglobin: 11.2 g/dL — ABNORMAL LOW (ref 12.0–15.0)
Lymphocytes Relative: 22 % (ref 12–46)
Lymphs Abs: 1.2 10*3/uL (ref 0.7–4.0)
MCH: 33.1 pg (ref 26.0–34.0)
MCV: 98.2 fL (ref 78.0–100.0)
Monocytes Absolute: 0.8 10*3/uL (ref 0.1–1.0)
Monocytes Relative: 15 % — ABNORMAL HIGH (ref 3–12)
Platelets: 240 10*3/uL (ref 150–400)
RBC: 3.38 MIL/uL — ABNORMAL LOW (ref 3.87–5.11)
WBC: 5.4 10*3/uL (ref 4.0–10.5)

## 2012-02-17 LAB — COMPREHENSIVE METABOLIC PANEL
AST: 17 U/L (ref 0–37)
CO2: 27 mEq/L (ref 19–32)
Calcium: 9.4 mg/dL (ref 8.4–10.5)
Creatinine, Ser: 0.56 mg/dL (ref 0.50–1.10)
GFR calc non Af Amer: >90 mL/min (ref >90)

## 2012-02-17 MED ORDER — SODIUM CHLORIDE 0.9 % IJ SOLN
INTRAMUSCULAR | Status: AC
  Filled 2012-02-17: qty 20

## 2012-02-17 MED ORDER — HEPARIN SOD (PORK) LOCK FLUSH 100 UNIT/ML IV SOLN
INTRAVENOUS | Status: AC
  Filled 2012-02-17: qty 5

## 2012-02-17 NOTE — Patient Instructions (Signed)
[facility]  Discharge Instructions  RECOMMENDATIONS MADE BY THE CONSULTANT AND ANY TEST RESULTS WILL BE SENT TO YOUR REFERRING DOCTOR.   SPECIAL INSTRUCTIONS/FOLLOW-UP: Return to Clinic as scheduled to see Dr. Mariel Sleet.   I acknowledge that I have been informed and understand all the instructions given to me and received a copy. I do not have any more questions at this time, but understand that I may call the Specialty Clinic at [facility] at [phone] during business hours should I have any further questions or need assistance in obtaining follow-up care.    __________________________________________  _____________  __________ Signature of Patient or Authorized Representative            Date                   Time    __________________________________________ Nurse's Signature

## 2012-02-17 NOTE — Progress Notes (Signed)
Bethany Asa, MD [address] [place] [address]  1. Breast cancer-left breast     CURRENT THERAPY:S/P 6 cycles of dose dense FEC.  She is due to embark on Taxol x 4 02/20/2012   INTERVAL HISTORY: Bethany Michael 51 y.o. 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  [name] is doing well.  She did purchase some Tums and is using these successfully for her GERD symptoms.  She is accompanied by her Ex-husband Bethany Michael who is a patient of ours and he asks for a refill on his Rxs which I will do.   Desa reports some color changes of her fingernails.  They are yellowing and this is from chemotherapy administration.  She also reports some fingernail tenderness and tenderness of her fingertips.  She denies this interfering with ADLS and she is able to manipulate her fingers appropriately and button buttons and zippers.     She is prepared for chemotherapy on Monday.  Complete Oncologic ROS questioning is negative.   She does report some constipation, but she had a good BM today.  She is using Senokot-S and I recommended she have some MiraLax or MOM on hand and use it as needed.      Past Medical History  Diagnosis Date  . Cancer     breast    has Breast cancer-left breast on her problem list.      has no known allergies.  Ms. Vavra does not currently have medications on file.  Past Surgical History  Procedure Date  . Cesarean section     3 previous  . Portacath placement 11/17/2011    Procedure: INSERTION PORT-A-CATH;  Surgeon: Adolph Pollack, MD;  Location:  SUR[facility]ERY CENTER;  Service: General;  Laterality: N/A;  ultrasound guided Portacath insertion right side    Denies any headaches, dizziness, double vision, fevers, chills, night sweats, nausea, vomiting, diarrhea, chest pain, heart palpitations, shortness of breath, blood in stool,  black tarry stool, urinary pain, urinary burning, urinary frequency, hematuria.   PHYSICAL EXAMINATION  ECOG PERFORMANCE STATUS: 1 - Symptomatic but completely ambulatory  Filed Vitals:   02/17/12 1117  BP: 99/38  Pulse: 74  Temp: 97.7 F (36.5 C)  Resp: 14    GENERAL:alert, no distress, well nourished, well developed, comfortable, cooperative and smiling SKIN: skin color, texture, turgor are normal, no rashes or significant lesions HEAD: Normocephalic, No masses, lesions, tenderness or abnormalities EYES: normal, Conjunctiva are pink and non-injected EARS: External ears normal OROPHARYNX:lips, buccal mucosa, and tongue normal and mucous membranes are moist  NECK: supple, no adenopathy, thyroid normal size, non-tender, without nodularity, no stridor, non-tender, trachea midline LYMPH:  no palpable lymphadenopathy, no hepatosplenomegaly BREAST:right breast normal without mass, skin or nipple changes or axillary nodes, left mass is much more pliable and less hard measuring 1.5 x 4.5 cm deep the the areola and nipple is normalizing and not nearly as distorted.  LUNGS: clear to auscultation and percussion HEART: regular rate & rhythm, no murmurs, no gallops, S1 normal and S2 normal ABDOMEN:abdomen soft, non-tender, normal bowel sounds, no masses or organomegaly and no hepatosplenomegaly BACK: Back symmetric, no curvature., No CVA tenderness EXTREMITIES:less then 2 second capillary refill, no joint deformities, effusion, or inflammation, no edema, no skin discoloration, no clubbing, no cyanosis, positive findings:  Fingernail yellowing with minimal tenderness of nails and fingertips.  NEURO: alert & oriented x 3 with fluent speech, no focal motor/sensory deficits, gait normal   LABORATORY DATA: CBC    Component Value Date/Time   WBC 3.6* 01/27/2012 1005   WBC 4.7 11/01/2011 0924   RBC 3.31* 01/27/2012 1005   RBC 4.18 11/01/2011 0924   HGB 10.5* 01/27/2012 1005   HGB 13.2 11/01/2011 0924     HCT 30.7* 01/27/2012 1005   HCT 39.6 11/01/2011 0924   PLT 148* 01/27/2012 1005   PLT 197 11/01/2011 0924   MCV 92.7 01/27/2012 1005   MCV 94.8 11/01/2011 0924   MCH 31.7 01/27/2012 1005   MCH 31.6 11/01/2011 0924   MCHC 34.2 01/27/2012 1005   MCHC 33.4 11/01/2011 0924   RDW 16.0* 01/27/2012 1005   RDW 13.9 11/01/2011 0924   LYMPHSABS 1.3 01/27/2012 1005   LYMPHSABS 1.3 11/01/2011 0924   MONOABS 0.3 01/27/2012 1005   MONOABS 0.4 11/01/2011 0924   EOSABS 0.0 01/27/2012 1005   EOSABS 0.1 11/01/2011 0924   BASOSABS 0.1 01/27/2012 1005   BASOSABS 0.1 11/01/2011 0924      Chemistry      Component Value Date/Time   NA 139 01/27/2012 1005   K 3.5 01/27/2012 1005   CL 105 01/27/2012 1005   CO2 26 01/27/2012 1005   BUN 19 01/27/2012 1005   CREATININE 0.65 01/27/2012 1005      Component Value Date/Time   CALCIUM 9.9 01/27/2012 1005   ALKPHOS 77 01/27/2012 1005   AST 13 01/27/2012 1005   ALT 11 01/27/2012 1005   BILITOT 0.2* 01/27/2012 1005       PENDING LABS: CBC diff, CMET   PATHOLOGY: 10/13/2011  ADDITIONAL INFORMATION:  PROGNOSTIC INDICATORS - ACIS  Results  IMMUNOHISTOCHEMICAL AND MORPHOMETRIC ANALYSIS BY THE AUTOMATED CELLULAR  IMAGING SYSTEM (ACIS)  Estrogen Receptor (Negative, <1%): 0%, NEGATIVE  Progesterone Receptor (Negative, <1%): 0%, NEGATIVE  Proliferation Marker Ki67 by M IB-1 (Low<20%): 50%  COMMENT: The negative hormone receptor study(ies) in this case have no internal positive control.  All controls stained appropriately  Pecola Leisure MD  Pathologist, Electronic Signature  ( Signed 10/19/2011)  CHROMOGENIC IN-SITU HYBRIDIZATION  Interpretation  HER-2/NEU BY CISH - NO AMPLIFICATION OF HER-2 DETECTED. THE RATIO OF HER-2: CEP 17  SIGNALS WAS 1.13.  Reference range:  Ratio: HER2:CEP17 < 1.8 - gene amplification not observed  Ratio: HER2:CEP 17 1.8-2.2 - equivocal result  Ratio: HER2:CEP17 > 2.2 - gene amplification observed  1 of 3  FINAL for KHYLI, SWAIM ([id number])  ADDITIONAL  INFORMATION:(continued)  Pecola Leisure MD  Pathologist, Electronic Signature  ( Signed 10/18/2011)  FINAL DIAGNOSIS  Diagnosis  Breast, left, needle core biopsy, mass, 12 o'clock  -INVASIVE DUCTAL CARCINOMA, SEE COMMENT.  Microscopic Comment  Although the grade of tumor is best assessed at resection, with these biopsies, the invasive tumor is grade II.  Breast prognostic studies is pending and reported in an addendum. The case is reviewed with Dr. Raynald Blend who  concurs. (CR:mw 10-14-11)  [place] RUND DO  Pathologist, Electronic Signature  (Case signed 10/14/2011)    11/07/2011  Diagnosis  Breast, right, needle core biopsy, retroareolar/central  - RADIAL/COMPLEX SCLEROSING LESION WITH PAPILLARY ARCHITECTURE SEE COMMENT  - MICROCALCIFICATIONS PRESENT  Microscopic Comment  The myoepithelial layer is demonstrated with calponin, p63 and smooth muscle mycin immunohistochemical  stains. Intraductal papilloma is in the differential diagnosis. Excision is recommended to further characterize  the extent and severity of the lesion. The case was reviewed with Dr Colonel Bald,  who concurs. (CR:kh 11-08-11)  [place] RUND DO  Pathologist, Electronic Signature  (Case signed 11/09/2011)    ASSESSMENT:  1. 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  2. Indigestion, declined therapy   PLAN:  1. Jyra will move on with Taxol as scheduled.  2. Pre-chemo labs today: CBC diff, CMET 3. Will start Taxol chemotherapy on Monday 02/20/2012 4. We discussed the side effects of Taxol including PN, and musculoskeletal pain.  She has been tolerating Neulasta without any issues.  5. Return as scheduled to see Dr. Mariel Sleet   All questions were answered. The patient knows to call the clinic with any problems, questions or concerns. We can certainly see the patient much sooner if necessary.  The patient and plan discussed with Glenford Peers, MD and  he is in agreement with the aforementioned.  KEFALAS,THOMAS

## 2012-02-20 ENCOUNTER — Encounter (HOSPITAL_BASED_OUTPATIENT_CLINIC_OR_DEPARTMENT_OTHER): Payer: Medicaid Other

## 2012-02-20 VITALS — BP 117/69 | HR 65 | Temp 98.0°F | Resp 18 | Wt 146.0 lb

## 2012-02-20 DIAGNOSIS — Z5111 Encounter for antineoplastic chemotherapy: Secondary | ICD-10-CM

## 2012-02-20 DIAGNOSIS — C50919 Malignant neoplasm of unspecified site of unspecified female breast: Secondary | ICD-10-CM

## 2012-02-20 DIAGNOSIS — R110 Nausea: Secondary | ICD-10-CM

## 2012-02-20 MED ORDER — SODIUM CHLORIDE 0.9 % IJ SOLN
10.0000 mL | INTRAMUSCULAR | Status: DC | PRN
  Administered 2012-02-20: 10 mL
  Filled 2012-02-20: qty 10

## 2012-02-20 MED ORDER — DEXAMETHASONE 4 MG PO TABS: ORAL_TABLET | ORAL | Status: DC

## 2012-02-20 MED ORDER — SODIUM CHLORIDE 0.9 % IV SOLN
Freq: Once | INTRAVENOUS | Status: AC
  Administered 2012-02-20: 11:00:00 via INTRAVENOUS

## 2012-02-20 MED ORDER — SODIUM CHLORIDE 0.9 % IV SOLN: 8.0000 mg | Freq: Once | INTRAVENOUS | Status: DC

## 2012-02-20 MED ORDER — HEPARIN SOD (PORK) LOCK FLUSH 100 UNIT/ML IV SOLN
INTRAVENOUS | Status: AC
  Filled 2012-02-20: qty 5

## 2012-02-20 MED ORDER — HEPARIN SOD (PORK) LOCK FLUSH 100 UNIT/ML IV SOLN
500.0000 [IU] | Freq: Once | INTRAVENOUS | Status: AC | PRN
  Administered 2012-02-20: 500 [IU]
  Filled 2012-02-20: qty 5

## 2012-02-20 MED ORDER — DEXAMETHASONE SODIUM PHOSPHATE 10 MG/ML IJ SOLN: 20.0000 mg | Freq: Once | INTRAMUSCULAR | Status: DC

## 2012-02-20 MED ORDER — SODIUM CHLORIDE 0.9 % IV SOLN
Freq: Once | INTRAVENOUS | Status: AC
  Administered 2012-02-20: 8 mg via INTRAVENOUS
  Filled 2012-02-20: qty 4

## 2012-02-20 MED ORDER — DOCETAXEL CHEMO INJECTION 160 MG/16ML
75.0000 mg/m2 | Freq: Once | INTRAVENOUS | Status: AC
  Administered 2012-02-20: 140 mg via INTRAVENOUS
  Filled 2012-02-20: qty 14

## 2012-02-20 NOTE — Patient Instructions (Addendum)
[facility]  Bethany Michael  DOB 03/04/1961 CSN [id number]  MRN [id number] Dr. Glenford Peers  CHEMOTHERAPY INSTRUCTIONS Taxotere - bone marrow suppression (lowers white blood cells (fight infection), lowers red blood cells (make up your blood), lowers platelets (help blood to clot). This chemo can cause fluid retention. You will be responsible for taking a steroid called Dexamethasone at home prior to and after Taxotere. This steroid will keep you from having fluid retention. Take it whether you think you need it or not. Can cause hair loss, skin/nail changes (darkening of the nail beds, pain where the nail bed meets the skin, loosening of the nail beds, dry skin, palms of hands and soles of feet may darken or get sensitive, nausea/vomiting, paresthesia (numbness or tingling) in extremities - we need to know if this develops, mucositis (inflammation of any mucosal membrane - the mouth, throat), mouth sores, neurotoxicity (loss of memory, headaches, trouble sleeping, etc.), can also cause excessive tear production. Please let us know if any side effect develops.    POTENTIAL SIDE EFFECTS OF TREATMENT: Increased Susceptibility to Infection, Vomiting, Constipation, Changes in Character of Skin and Nails (brittleness, dryness,etc.), Pigment Changes (darkening of veins, nail beds, palms of hands, soles of feet, etc.), Bone Marrow Suppression, Complete Hair Loss and Nausea   You have been given prescriptions for the following medications: Decadron 4mg --take 2 tablets the day before day of and day after chemo. Follow directions on label and Take with food or milk Zofran 8 mg take 1 tablet twice a day starting the day after chemo and continue as needed. Other Medication Instructions: lorazepam or ativan take 1 mg every 2-3 hours as needed for nausea.          SYMPTOMS TO REPORT AS SOON AS POSSIBLE AFTER TREATMENT:  FEVER GREATER THAN 100.5 F  CHILLS WITH OR  WITHOUT FEVER  NAUSEA AND VOMITING THAT IS NOT CONTROLLED WITH YOUR NAUSEA MEDICATION  UNUSUAL SHORTNESS OF BREATH  UNUSUAL BRUISING OR BLEEDING  TENDERNESS IN MOUTH AND THROAT WITH OR WITHOUT PRESENCE OF ULCERS  URINARY PROBLEMS  BOWEL PROBLEMS  UNUSUAL RASH    Wear comfortable clothing and clothing appropriate for easy access to any Portacath or PICC line. Let us know if there is anything that we can do to make your therapy better!      I have been informed and understand all of the instructions given to me and have received a copy. I have been instructed to call the clinic [phone] or my family physician as soon as possible for continued medical care, if indicated. I do not have any more questions at this time but understand that I may call the Cancer Center or the Patient Navigator at [phone] during office hours should I have questions or need assistance in obtaining follow-up care.      _________________________________________      _______________     __________ Signature of Patient or Authorized Representative        Date                            Time      _________________________________________ Nurse's Signature

## 2012-02-20 NOTE — Progress Notes (Signed)
Pt was not aware to take dexamethasone pre taxotere. Discussed with Dr. Mariel Sleet and decadron 20 mg today pre med then she is to take 8 mg tonight tomorrow am and tomorrow pm. Pt aware. Pt tolerated well

## 2012-02-21 ENCOUNTER — Encounter (HOSPITAL_BASED_OUTPATIENT_CLINIC_OR_DEPARTMENT_OTHER): Payer: Medicaid Other

## 2012-02-21 DIAGNOSIS — C50919 Malignant neoplasm of unspecified site of unspecified female breast: Secondary | ICD-10-CM

## 2012-02-21 DIAGNOSIS — Z5189 Encounter for other specified aftercare: Secondary | ICD-10-CM

## 2012-02-21 MED ORDER — DEXAMETHASONE 4 MG PO TABS: ORAL_TABLET | ORAL | Status: DC

## 2012-02-21 MED ORDER — PEGFILGRASTIM INJECTION 6 MG/0.6ML
SUBCUTANEOUS | Status: AC
  Filled 2012-02-21: qty 0.6

## 2012-02-21 MED ORDER — PEGFILGRASTIM INJECTION 6 MG/0.6ML
6.0000 mg | Freq: Once | SUBCUTANEOUS | Status: AC
  Administered 2012-02-21: 6 mg via SUBCUTANEOUS

## 2012-02-21 NOTE — Progress Notes (Signed)
Bethany Michael presents today for injection per MD orders. Neulasta 6mg administered SQ in right Abdomen. Administration without incident. Patient tolerated well.  

## 2012-03-05 ENCOUNTER — Encounter (HOSPITAL_COMMUNITY): Payer: Medicaid Other | Attending: Oncology

## 2012-03-05 VITALS — BP 102/64 | HR 71 | Temp 97.8°F | Resp 18 | Wt 142.0 lb

## 2012-03-05 DIAGNOSIS — R110 Nausea: Secondary | ICD-10-CM

## 2012-03-05 DIAGNOSIS — Z5111 Encounter for antineoplastic chemotherapy: Secondary | ICD-10-CM

## 2012-03-05 DIAGNOSIS — C50919 Malignant neoplasm of unspecified site of unspecified female breast: Secondary | ICD-10-CM | POA: Insufficient documentation

## 2012-03-05 LAB — CBC WITH DIFFERENTIAL/PLATELET
Eosinophils Absolute: 0 10*3/uL (ref 0.0–0.7)
Eosinophils Relative: 0 % (ref 0–5)
Hemoglobin: 10.5 g/dL — ABNORMAL LOW (ref 12.0–15.0)
Lymphs Abs: 0.7 10*3/uL (ref 0.7–4.0)
MCH: 32.6 pg (ref 26.0–34.0)
MCV: 97.2 fL (ref 78.0–100.0)
Monocytes Relative: 4 % (ref 3–12)
Neutrophils Relative %: 93 % — ABNORMAL HIGH (ref 43–77)
RBC: 3.22 MIL/uL — ABNORMAL LOW (ref 3.87–5.11)

## 2012-03-05 LAB — COMPREHENSIVE METABOLIC PANEL
ALT: 14 U/L (ref 0–35)
AST: 16 U/L (ref 0–37)
Albumin: 3.7 g/dL (ref 3.5–5.2)
CO2: 25 mEq/L (ref 19–32)
Calcium: 9.7 mg/dL (ref 8.4–10.5)
Creatinine, Ser: 0.63 mg/dL (ref 0.50–1.10)
GFR calc non Af Amer: >90 mL/min (ref >90)
Sodium: 137 mEq/L (ref 135–145)
Total Protein: 6.4 g/dL (ref 6.0–8.3)

## 2012-03-05 MED ORDER — SODIUM CHLORIDE 0.9 % IV SOLN
Freq: Once | INTRAVENOUS | Status: AC
  Administered 2012-03-05: 09:00:00 via INTRAVENOUS

## 2012-03-05 MED ORDER — SODIUM CHLORIDE 0.9 % IJ SOLN
INTRAMUSCULAR | Status: AC
  Filled 2012-03-05: qty 10

## 2012-03-05 MED ORDER — HEPARIN SOD (PORK) LOCK FLUSH 100 UNIT/ML IV SOLN
INTRAVENOUS | Status: AC
  Filled 2012-03-05: qty 5

## 2012-03-05 MED ORDER — DEXAMETHASONE SODIUM PHOSPHATE 10 MG/ML IJ SOLN: 10.0000 mg | Freq: Once | INTRAMUSCULAR | Status: DC

## 2012-03-05 MED ORDER — SODIUM CHLORIDE 0.9 % IJ SOLN
10.0000 mL | INTRAMUSCULAR | Status: DC | PRN
  Administered 2012-03-05: 10 mL
  Filled 2012-03-05: qty 10

## 2012-03-05 MED ORDER — DOCETAXEL CHEMO INJECTION 160 MG/16ML
75.0000 mg/m2 | Freq: Once | INTRAVENOUS | Status: AC
  Administered 2012-03-05: 140 mg via INTRAVENOUS
  Filled 2012-03-05: qty 14

## 2012-03-05 MED ORDER — SODIUM CHLORIDE 0.9 % IV SOLN: 8.0000 mg | Freq: Once | INTRAVENOUS | Status: DC

## 2012-03-05 MED ORDER — SODIUM CHLORIDE 0.9 % IV SOLN
Freq: Once | INTRAVENOUS | Status: AC
  Administered 2012-03-05: 8 mg via INTRAVENOUS
  Filled 2012-03-05: qty 4

## 2012-03-05 MED ORDER — HEPARIN SOD (PORK) LOCK FLUSH 100 UNIT/ML IV SOLN
500.0000 [IU] | Freq: Once | INTRAVENOUS | Status: AC | PRN
  Administered 2012-03-05: 500 [IU]
  Filled 2012-03-05: qty 5

## 2012-03-05 NOTE — Progress Notes (Signed)
Tolerated well

## 2012-03-06 ENCOUNTER — Encounter (HOSPITAL_BASED_OUTPATIENT_CLINIC_OR_DEPARTMENT_OTHER): Payer: Medicaid Other

## 2012-03-06 VITALS — BP 96/24 | HR 70

## 2012-03-06 DIAGNOSIS — D649 Anemia, unspecified: Secondary | ICD-10-CM

## 2012-03-06 DIAGNOSIS — R110 Nausea: Secondary | ICD-10-CM

## 2012-03-06 DIAGNOSIS — C50919 Malignant neoplasm of unspecified site of unspecified female breast: Secondary | ICD-10-CM

## 2012-03-06 MED ORDER — PEGFILGRASTIM INJECTION 6 MG/0.6ML
6.0000 mg | Freq: Once | SUBCUTANEOUS | Status: AC
  Administered 2012-03-06: 6 mg via SUBCUTANEOUS

## 2012-03-06 MED ORDER — PEGFILGRASTIM INJECTION 6 MG/0.6ML
SUBCUTANEOUS | Status: AC
  Filled 2012-03-06: qty 0.6

## 2012-03-06 NOTE — Progress Notes (Signed)
Bethany Michael presents today for injection per MD orders. Neulasta 6mg  administered SQ in left Abdomen. Administration without incident. Patient tolerated well.

## 2012-03-16 ENCOUNTER — Encounter (HOSPITAL_BASED_OUTPATIENT_CLINIC_OR_DEPARTMENT_OTHER): Payer: Medicaid Other

## 2012-03-16 ENCOUNTER — Other Ambulatory Visit (HOSPITAL_COMMUNITY): Payer: Self-pay | Admitting: Oncology

## 2012-03-16 DIAGNOSIS — E876 Hypokalemia: Secondary | ICD-10-CM

## 2012-03-16 DIAGNOSIS — C50919 Malignant neoplasm of unspecified site of unspecified female breast: Secondary | ICD-10-CM

## 2012-03-16 LAB — CBC WITH DIFFERENTIAL/PLATELET
Basophils Absolute: 0 10*3/uL (ref 0.0–0.1)
Basophils Relative: 0 % (ref 0–1)
Eosinophils Absolute: 0 10*3/uL (ref 0.0–0.7)
Eosinophils Relative: 0 % (ref 0–5)
Hemoglobin: 10.6 g/dL — ABNORMAL LOW (ref 12.0–15.0)
Lymphocytes Relative: 10 % — ABNORMAL LOW (ref 12–46)
Lymphs Abs: 1.2 10*3/uL (ref 0.7–4.0)
Monocytes Absolute: 1 10*3/uL (ref 0.1–1.0)
Neutro Abs: 9.3 10*3/uL — ABNORMAL HIGH (ref 1.7–7.7)
Neutrophils Relative %: 68 % (ref 43–77)
RBC: 3.28 MIL/uL — ABNORMAL LOW (ref 3.87–5.11)

## 2012-03-16 LAB — COMPREHENSIVE METABOLIC PANEL
AST: 20 U/L (ref 0–37)
Albumin: 3.7 g/dL (ref 3.5–5.2)
Calcium: 9.5 mg/dL (ref 8.4–10.5)
Creatinine, Ser: 0.55 mg/dL (ref 0.50–1.10)
GFR calc non Af Amer: >90 mL/min (ref >90)

## 2012-03-16 MED ORDER — SODIUM CHLORIDE 0.9 % IJ SOLN
10.0000 mL | INTRAMUSCULAR | Status: DC | PRN
Start: 2012-03-16 — End: 2012-03-16
  Administered 2012-03-16: 10 mL via INTRAVENOUS
  Filled 2012-03-16: qty 10

## 2012-03-16 MED ORDER — SODIUM CHLORIDE 0.9 % IJ SOLN
INTRAMUSCULAR | Status: AC
  Filled 2012-03-16: qty 10

## 2012-03-16 MED ORDER — HEPARIN SOD (PORK) LOCK FLUSH 100 UNIT/ML IV SOLN
500.0000 [IU] | Freq: Once | INTRAVENOUS | Status: AC
Start: 2012-03-16 — End: 2012-03-16
  Administered 2012-03-16: 500 [IU] via INTRAVENOUS
  Filled 2012-03-16: qty 5

## 2012-03-16 MED ORDER — POTASSIUM CHLORIDE CRYS ER 20 MEQ PO TBCR: 20.0000 meq | EXTENDED_RELEASE_TABLET | Freq: Every day | ORAL | Status: DC

## 2012-03-16 MED ORDER — HEPARIN SOD (PORK) LOCK FLUSH 100 UNIT/ML IV SOLN
INTRAVENOUS | Status: AC
  Filled 2012-03-16: qty 5

## 2012-03-16 NOTE — Progress Notes (Signed)
Bethany Michael presented for Portacath access and flush.  Proper placement of portacath confirmed by CXR.  Portacath located right chest wall accessed with  H 20 needle.  Good blood return present. Portacath flushed with 20ml NS and 500U/3ml Heparin and needle removed intact.  Procedure without incident.  Patient tolerated procedure well.  Pt was noted to have a red, erythemas, non-itching rash to her hands, face, trunk, upper extremities; also noted to have involvement with her fingernails, which appear to be peeling off - T. Kefalas, PA-C notified of same.  Patient was advised to keep her fingernails cut short; not to use petroleum products to her hands, and to avoid using latex gloves at work.

## 2012-03-19 ENCOUNTER — Encounter (HOSPITAL_BASED_OUTPATIENT_CLINIC_OR_DEPARTMENT_OTHER): Payer: Medicaid Other

## 2012-03-19 VITALS — BP 107/69 | HR 18 | Temp 98.1°F | Resp 18

## 2012-03-19 DIAGNOSIS — C50919 Malignant neoplasm of unspecified site of unspecified female breast: Secondary | ICD-10-CM

## 2012-03-19 DIAGNOSIS — Z5111 Encounter for antineoplastic chemotherapy: Secondary | ICD-10-CM

## 2012-03-19 DIAGNOSIS — R110 Nausea: Secondary | ICD-10-CM

## 2012-03-19 MED ORDER — HEPARIN SOD (PORK) LOCK FLUSH 100 UNIT/ML IV SOLN
500.0000 [IU] | Freq: Once | INTRAVENOUS | Status: AC | PRN
  Administered 2012-03-19: 500 [IU]
  Filled 2012-03-19: qty 5

## 2012-03-19 MED ORDER — HEPARIN SOD (PORK) LOCK FLUSH 100 UNIT/ML IV SOLN
INTRAVENOUS | Status: AC
  Filled 2012-03-19: qty 5

## 2012-03-19 MED ORDER — DOCETAXEL CHEMO INJECTION 160 MG/16ML
75.0000 mg/m2 | Freq: Once | INTRAVENOUS | Status: AC
  Administered 2012-03-19: 140 mg via INTRAVENOUS
  Filled 2012-03-19: qty 14

## 2012-03-19 MED ORDER — SODIUM CHLORIDE 0.9 % IV SOLN
Freq: Once | INTRAVENOUS | Status: AC
  Administered 2012-03-19: 10:00:00 via INTRAVENOUS

## 2012-03-19 MED ORDER — SODIUM CHLORIDE 0.9 % IV SOLN
Freq: Once | INTRAVENOUS | Status: AC
  Administered 2012-03-19: 8 mg via INTRAVENOUS
  Filled 2012-03-19: qty 4

## 2012-03-19 MED ORDER — SODIUM CHLORIDE 0.9 % IJ SOLN
10.0000 mL | INTRAMUSCULAR | Status: DC | PRN
  Administered 2012-03-19: 10 mL
  Filled 2012-03-19: qty 10

## 2012-03-19 NOTE — Progress Notes (Signed)
Pt states rash on Friday was flea bites and is all cleared up now. No rash noted.

## 2012-03-20 ENCOUNTER — Encounter (HOSPITAL_BASED_OUTPATIENT_CLINIC_OR_DEPARTMENT_OTHER): Payer: Medicaid Other | Admitting: Oncology

## 2012-03-20 ENCOUNTER — Encounter (HOSPITAL_COMMUNITY): Payer: Medicaid Other

## 2012-03-20 VITALS — BP 107/66 | HR 83 | Temp 97.2°F | Resp 18 | Wt 146.2 lb

## 2012-03-20 DIAGNOSIS — R110 Nausea: Secondary | ICD-10-CM

## 2012-03-20 DIAGNOSIS — C50919 Malignant neoplasm of unspecified site of unspecified female breast: Secondary | ICD-10-CM

## 2012-03-20 DIAGNOSIS — Z5189 Encounter for other specified aftercare: Secondary | ICD-10-CM

## 2012-03-20 MED ORDER — PEGFILGRASTIM INJECTION 6 MG/0.6ML
SUBCUTANEOUS | Status: AC
  Filled 2012-03-20: qty 0.6

## 2012-03-20 MED ORDER — PEGFILGRASTIM INJECTION 6 MG/0.6ML
6.0000 mg | Freq: Once | SUBCUTANEOUS | Status: AC
  Administered 2012-03-20: 6 mg via SUBCUTANEOUS

## 2012-03-20 NOTE — Progress Notes (Signed)
Bethany Michael presents today for injection per MD orders. Neulasta 6mg administered SQ in right Abdomen. Administration without incident. Patient tolerated well.  

## 2012-03-20 NOTE — Patient Instructions (Signed)
[facility]  Discharge Instructions  RECOMMENDATIONS MADE BY THE CONSULTANT AND ANY TEST RESULTS WILL BE SENT TO YOUR REFERRING DOCTOR.   EXAM FINDINGS BY MD TODAY AND SIGNS AND SYMPTOMS TO REPORT TO CLINIC OR PRIMARY MD: Exam findings as discussed by Dr. Mariel Sleet.  SPECIAL INSTRUCTIONS/FOLLOW-UP: 1.  We will be scheduling you with Dr. Avel Peace for eval. for surgery. 2.  Please keep your appointment with Dr. Mariel Sleet in December; please feel free to contact us sooner if indicated.  I acknowledge that I have been informed and understand all the instructions given to me and received a copy. I do not have any more questions at this time, but understand that I may call the Specialty Clinic at [facility] at [phone] during business hours should I have any further questions or need assistance in obtaining follow-up care.    __________________________________________  _____________  __________ Signature of Patient or Authorized Representative            Date                   Time    __________________________________________ Nurse's Signature

## 2012-03-20 NOTE — Progress Notes (Signed)
Problem #1 locally advanced breast cancer on the left approximately 4 cm in size clinically ER, PR negative Ki-67 are her 50% HER-2/neu not amplified. MRI revealed a 4.9 x 4.9 x 3.9 cm mass about seen the pectoralis fascia with skin thickening but no obvious axillary nodes. She also had atypical cells and a small nodule in the right breast in the retroareolar area and wants to proceed with bilateral mastectomies when the time comes. She was treated with dose dense FEC x6 cycles and has had 3 cycles of dose dense Taxotere. She has the fourth and final cycle coming up on November 4. I have talked to Dr. Abbey Chatters who will see her in the next week to set up a surgical procedure. Today on exam she has no distinct adenopathy. There is a question of a nodule very anterior the left axilla and I wonder if it the nodule in her breast realistically. I think there is still1 1/2 cm nodule in the superior portion of the left breast at the 12:00 position. She also has I think fibroglandular changes in the remainder of the left breast. There are no skin changes presently that I can appreciate. The right breast is negative for any masses. He does have fibroglandular changes. She has no supra-auricular nodes. Vital signs are stable. Her lungs are clear. Heart shows a regular rhythm and rate without murmur rub or gallop. The right-sided Port-A-Cath is intact. Abdomen reveals no organomegaly. She has no leg edema no arm edema.  She will see Dr. Abbey Chatters soon and Dr. Basilio Cairo as well. I think she should have surgery no later than the first week in December which is right after Thanksgiving. She would like to delay surgery until then at least. She wanted to delay surgery until after Christmas which I do not think is appropriate. We will see her in 4-6 weeks which should be after her surgery and pathology are back.

## 2012-03-21 ENCOUNTER — Telehealth (INDEPENDENT_AMBULATORY_CARE_PROVIDER_SITE_OTHER): Payer: Self-pay

## 2012-03-21 NOTE — Telephone Encounter (Signed)
[facility] pt has appt on 03/26/12 with Dr. Abbey Chatters.

## 2012-03-26 ENCOUNTER — Encounter (INDEPENDENT_AMBULATORY_CARE_PROVIDER_SITE_OTHER): Payer: Self-pay | Admitting: General Surgery

## 2012-03-26 ENCOUNTER — Ambulatory Visit (INDEPENDENT_AMBULATORY_CARE_PROVIDER_SITE_OTHER): Payer: Medicaid Other | Admitting: General Surgery

## 2012-03-26 VITALS — BP 118/60 | HR 84 | Temp 97.2°F | Resp 18 | Ht 67.0 in | Wt 140.2 lb

## 2012-03-26 DIAGNOSIS — C50919 Malignant neoplasm of unspecified site of unspecified female breast: Secondary | ICD-10-CM

## 2012-03-26 DIAGNOSIS — C50912 Malignant neoplasm of unspecified site of left female breast: Secondary | ICD-10-CM

## 2012-03-26 NOTE — Progress Notes (Signed)
Patient ID: Bethany Michael, female   DOB: December 20, 1960, 51 y.o.   MRN: [id number]  No chief complaint on file.   HPI Bethany Michael is a 51 y.o. female.   HPI  She noticed a small left breast mass at least 5 years ago and presented to her PCP because of that. A plan of action was suggested but she did not go through with it because her husband was diagnosed with prostate cancer at the time. The mass has been getting larger and show noticed some transient left arm swelling. The arm swelling  resolved but it prompted her to seek medical attention for the mass. She underwent a mammogram and [place] which demonstrated a 3.8 cm left breast mass. There were also some areas of concern in the right breast. 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 Proliferation rate is 67%. MRI demonstrated the left breast masses as well as worrisome areas in the right breast.   These were benign.  She has undergone neoadjuvant chemotherapy with a good result and that the mass is shrinking. She has one more treatment. She is here to discuss definitive surgery. She wants to have bilateral mastectomies.   Past Medical History  Diagnosis Date  . Cancer     breast    Past Surgical History  Procedure Date  . Cesarean section     3 previous  . Portacath placement 11/17/2011    Procedure: INSERTION PORT-A-CATH;  Surgeon: Adolph Pollack, MD;  Location: [facility];  Service: General;  Laterality: N/A;  ultrasound guided Portacath insertion right side    Family History  Problem Relation Age of Onset  . Diabetes Mother     Social History History  Substance Use Topics  . Smoking status: Never Smoker   . Smokeless tobacco: Never Used  . Alcohol Use: Yes     occassionally    No Known Allergies  Current Outpatient Prescriptions  Medication Sig Dispense Refill  . dexamethasone (DECADRON) 4 MG tablet Take 2 tabs two times a day the  day before, day of and day after Taxotere chemo.  30 tablet  1  . potassium chloride SA (K-DUR,KLOR-CON) 20 MEQ tablet Take 1 tablet (20 mEq total) by mouth daily.  30 tablet  0  . sennosides-docusate sodium (SENOKOT-S) 8.6-50 MG tablet Take 1 tablet by mouth as needed.       No current facility-administered medications for this visit.   Facility-Administered Medications Ordered in Other Visits  Medication Dose Route Frequency Provider Last Rate Last Dose  . lidocaine-prilocaine (EMLA) cream   Topical Once Pierce Crane, MD        Review of Systems Review of Systems  Constitutional: Positive for fatigue.  Respiratory: Negative.   Cardiovascular: Negative.   Gastrointestinal: Negative.   Musculoskeletal: Positive for myalgias.    Blood pressure 118/60, pulse 84, temperature 97.2 F (36.2 C), temperature source Temporal, resp. rate 18, height 5\' 7"  (1.702 m), weight 140 lb 3.2 oz (63.594 kg).  Physical Exam Physical Exam  Constitutional: She appears well-developed and well-nourished. No distress.  HENT:  Head: Normocephalic and atraumatic.       Alopecia is present.  Eyes: No scleral icterus.  Neck: Neck supple.  Cardiovascular: Normal rate and regular rhythm.   Pulmonary/Chest: Effort normal and breath sounds normal.       Right breast is without masses.  Left breast mass is significantly smaller.  Abdominal:  Soft. She exhibits no distension and no mass. There is no tenderness.  Musculoskeletal: Normal range of motion. She exhibits no edema.       No axillary or supraclavicular adenopathy.  Lymphadenopathy:    She has no cervical adenopathy.  Skin: Skin is warm and dry.  Psychiatric: She has a normal mood and affect. Her behavior is normal.    Data Reviewed Old notes.  Assessment    Left breast cancer-she is completing her neoadjuvant chemotherapy and has had a good response to this.    Plan    Bilateral mastectomies and left axillary sentinel lymph node biopsy at  the end of November or beginning of December.  She may need radiation and I told her that if she wanted reconstruction it should be delayed reconstruction.  I have explained the procedure, risks, and aftercare to her.  Risks include but are not limited to bleeding, infection, wound problems, anesthesia, chronic chest wall pain, nerve injury, seroma formation, lymphedema.  She seems to understand and agrees with the plan.       Karmen Altamirano J 03/26/2012, 12:19 PM

## 2012-03-26 NOTE — Patient Instructions (Signed)
We will schedule your surgery today. 

## 2012-04-02 ENCOUNTER — Encounter (HOSPITAL_COMMUNITY): Payer: Medicaid Other | Attending: Oncology

## 2012-04-02 DIAGNOSIS — C50919 Malignant neoplasm of unspecified site of unspecified female breast: Secondary | ICD-10-CM | POA: Insufficient documentation

## 2012-04-02 DIAGNOSIS — Z5111 Encounter for antineoplastic chemotherapy: Secondary | ICD-10-CM

## 2012-04-02 DIAGNOSIS — R609 Edema, unspecified: Secondary | ICD-10-CM | POA: Insufficient documentation

## 2012-04-02 DIAGNOSIS — R110 Nausea: Secondary | ICD-10-CM | POA: Insufficient documentation

## 2012-04-02 LAB — CBC WITH DIFFERENTIAL/PLATELET
Eosinophils Absolute: 0 10*3/uL (ref 0.0–0.7)
Eosinophils Relative: 0 % (ref 0–5)
Lymphs Abs: 0.6 10*3/uL — ABNORMAL LOW (ref 0.7–4.0)
MCH: 31.6 pg (ref 26.0–34.0)
MCHC: 32.5 g/dL (ref 30.0–36.0)
MCV: 97.3 fL (ref 78.0–100.0)
Platelets: 237 10*3/uL (ref 150–400)
RBC: 3.29 MIL/uL — ABNORMAL LOW (ref 3.87–5.11)
RDW: 14.9 % (ref 11.5–15.5)

## 2012-04-02 LAB — COMPREHENSIVE METABOLIC PANEL
ALT: 11 U/L (ref 0–35)
BUN: 15 mg/dL (ref 6–23)
Calcium: 9.7 mg/dL (ref 8.4–10.5)
GFR calc Af Amer: >90 mL/min (ref >90)
Glucose, Bld: 142 mg/dL — ABNORMAL HIGH (ref 70–99)
Sodium: 138 mEq/L (ref 135–145)
Total Protein: 6 g/dL (ref 6.0–8.3)

## 2012-04-02 MED ORDER — HEPARIN SOD (PORK) LOCK FLUSH 100 UNIT/ML IV SOLN
500.0000 [IU] | Freq: Once | INTRAVENOUS | Status: AC | PRN
  Administered 2012-04-02: 500 [IU]
  Filled 2012-04-02: qty 5

## 2012-04-02 MED ORDER — SODIUM CHLORIDE 0.9 % IJ SOLN
10.0000 mL | INTRAMUSCULAR | Status: DC | PRN
  Filled 2012-04-02: qty 10

## 2012-04-02 MED ORDER — SODIUM CHLORIDE 0.9 % IV SOLN
Freq: Once | INTRAVENOUS | Status: AC
  Administered 2012-04-02: 09:00:00 via INTRAVENOUS

## 2012-04-02 MED ORDER — DOCETAXEL CHEMO INJECTION 160 MG/16ML
75.0000 mg/m2 | Freq: Once | INTRAVENOUS | Status: AC
  Administered 2012-04-02: 140 mg via INTRAVENOUS
  Filled 2012-04-02: qty 14

## 2012-04-02 MED ORDER — SODIUM CHLORIDE 0.9 % IV SOLN
Freq: Once | INTRAVENOUS | Status: AC
  Administered 2012-04-02: 8 mg via INTRAVENOUS
  Filled 2012-04-02: qty 4

## 2012-04-02 MED ORDER — HEPARIN SOD (PORK) LOCK FLUSH 100 UNIT/ML IV SOLN
INTRAVENOUS | Status: AC
  Filled 2012-04-02: qty 5

## 2012-04-03 ENCOUNTER — Encounter (HOSPITAL_BASED_OUTPATIENT_CLINIC_OR_DEPARTMENT_OTHER): Payer: Medicaid Other

## 2012-04-03 VITALS — BP 103/46 | HR 61

## 2012-04-03 DIAGNOSIS — R110 Nausea: Secondary | ICD-10-CM

## 2012-04-03 DIAGNOSIS — C50919 Malignant neoplasm of unspecified site of unspecified female breast: Secondary | ICD-10-CM

## 2012-04-03 MED ORDER — PEGFILGRASTIM INJECTION 6 MG/0.6ML
6.0000 mg | Freq: Once | SUBCUTANEOUS | Status: AC
  Administered 2012-04-03: 6 mg via SUBCUTANEOUS

## 2012-04-03 MED ORDER — PEGFILGRASTIM INJECTION 6 MG/0.6ML
SUBCUTANEOUS | Status: AC
  Filled 2012-04-03: qty 0.6

## 2012-04-03 NOTE — Progress Notes (Signed)
Tolerated injection well. 

## 2012-04-06 ENCOUNTER — Ambulatory Visit (HOSPITAL_COMMUNITY): Payer: Medicaid Other

## 2012-04-18 ENCOUNTER — Encounter (HOSPITAL_BASED_OUTPATIENT_CLINIC_OR_DEPARTMENT_OTHER): Payer: Medicaid Other | Admitting: Oncology

## 2012-04-18 DIAGNOSIS — R609 Edema, unspecified: Secondary | ICD-10-CM

## 2012-04-18 DIAGNOSIS — C50919 Malignant neoplasm of unspecified site of unspecified female breast: Secondary | ICD-10-CM

## 2012-04-18 DIAGNOSIS — R600 Localized edema: Secondary | ICD-10-CM

## 2012-04-18 DIAGNOSIS — G620 Drug-induced polyneuropathy: Secondary | ICD-10-CM

## 2012-04-18 MED ORDER — SPIRONOLACTONE 50 MG PO TABS: 100.0000 mg | ORAL_TABLET | Freq: Two times a day (BID) | ORAL | Status: DC

## 2012-04-18 MED ORDER — FUROSEMIDE 20 MG PO TABS: 20.0000 mg | ORAL_TABLET | Freq: Every day | ORAL | Status: DC

## 2012-04-18 MED ORDER — HYDROCODONE-ACETAMINOPHEN 5-325 MG PO TABS: 1.0000 | ORAL_TABLET | Freq: Four times a day (QID) | ORAL | Status: DC | PRN

## 2012-04-18 NOTE — Progress Notes (Signed)
Bethany Michael is seen as a walk-in today.  She is accompanied by her ex-husband Bethany Michael today.  Bethany Michael reports that her LE have been causing her some dull, achy pain over the past few days.  She reports that she has worked the past 3 days and her LE are obviously larger than in the past.  She was on her feet all day over the past 3 days at work.  She reports that she has kept her LE elevated as much as possible.  In fact, her feet were painful yesterday.  On physical exam, her LE are both edematous.  There is 2+ nonpitting edema B/L.  No erythema, heat, or tenderness to palpation.    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  She reports that it is localized to her fingers only.  She reports that occasionally it is her whole phalange and other times it is only her fingertip.  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  I offered her Gabapentin and at this time, she has declined.   On physical exam, the patient has thickening and yellowing of her fingernails secondary to chemotherapy.  She has very dry skin on her hands as well.   Bethany Michael was pretty emotional today.  She was seen tearful.  She was hoping that she would feel back to her usual self at the completion of therapy and that is not the case.  This has been upsetting for her.  I have offered her the services of Bethany Michael and she will consider.  She denies feeling depressed or down in the dumps.  She denies crying at home. I provided her some education regarding the residual side-effects of chemotherapy and the time to resolution of some of these side effects.   I was concerned that Bethany Michael would have some emotional issues at some point because she never expressed any emotions or concerns while undergoing therapy.  She always put a positive spin on all news and treatment.  She did not have any issues with therapy and she almost had a denial sort of attitude with regards to her cancer and its  treatment.   I have given her 3 Rxs.   1. Lasix 20 mg daily x 5 days or until LE edema resolves. 2. Spironolactone 100 mg BID PRN until LE edema resolves. 3. Hydrocodone 5/325 for pain  I recommended the patient take Tylenol or NSAID for mild pain and save the narcotic for moderate to severe pain.  I think it is also wise for [place] to perform a 2D echo since this is a new symptom for her.  Her past 2D echo was great with EF at 55-60%.    Bethany Michael

## 2012-04-24 ENCOUNTER — Encounter (HOSPITAL_COMMUNITY)
Admission: RE | Admit: 2012-04-24 | Discharge: 2012-04-24 | Disposition: A | Discharge status: Home / Self Care | Payer: Medicaid Other | Source: Ambulatory Visit | Attending: General Surgery | Admitting: General Surgery

## 2012-04-24 ENCOUNTER — Encounter (HOSPITAL_COMMUNITY): Payer: Self-pay

## 2012-04-24 HISTORY — DX: Calculus of kidney: N20.0

## 2012-04-24 HISTORY — DX: Family history of other specified conditions: Z84.89

## 2012-04-24 HISTORY — DX: Thyrotoxicosis, unspecified without thyrotoxic crisis or storm: E05.90

## 2012-04-24 LAB — CBC WITH DIFFERENTIAL/PLATELET
Basophils Absolute: 0.1 10*3/uL (ref 0.0–0.1)
Basophils Relative: 1 % (ref 0–1)
Eosinophils Absolute: 0 10*3/uL (ref 0.0–0.7)
Eosinophils Relative: 1 % (ref 0–5)
HCT: 33.2 % — ABNORMAL LOW (ref 36.0–46.0)
Hemoglobin: 10.8 g/dL — ABNORMAL LOW (ref 12.0–15.0)
Lymphocytes Relative: 17 % (ref 12–46)
Lymphs Abs: 0.9 10*3/uL (ref 0.7–4.0)
MCH: 30.7 pg (ref 26.0–34.0)
MCHC: 32.5 g/dL (ref 30.0–36.0)
MCV: 94.3 fL (ref 78.0–100.0)
Monocytes Absolute: 0.9 10*3/uL (ref 0.1–1.0)
Monocytes Relative: 17 % — ABNORMAL HIGH (ref 3–12)
Neutro Abs: 3.3 10*3/uL (ref 1.7–7.7)
Neutrophils Relative %: 64 % (ref 43–77)
Platelets: 276 10*3/uL (ref 150–400)
RBC: 3.52 MIL/uL — ABNORMAL LOW (ref 3.87–5.11)
RDW: 16.3 % — ABNORMAL HIGH (ref 11.5–15.5)
WBC: 5.2 10*3/uL (ref 4.0–10.5)

## 2012-04-24 LAB — PROTIME-INR
INR: 0.98 (ref 0.00–1.49)
Prothrombin Time: 12.9 seconds (ref 11.6–15.2)

## 2012-04-24 LAB — COMPREHENSIVE METABOLIC PANEL
CO2: 27 mEq/L (ref 19–32)
Calcium: 9.5 mg/dL (ref 8.4–10.5)
Creatinine, Ser: 0.52 mg/dL (ref 0.50–1.10)
GFR calc Af Amer: >90 mL/min (ref >90)
GFR calc non Af Amer: >90 mL/min (ref >90)
Glucose, Bld: 91 mg/dL (ref 70–99)
Total Protein: 5.8 g/dL — ABNORMAL LOW (ref 6.0–8.3)

## 2012-04-24 LAB — TYPE AND SCREEN: ABO/RH(D): O NEG

## 2012-04-24 LAB — SURGICAL PCR SCREEN
MRSA, PCR: NEGATIVE
Staphylococcus aureus: POSITIVE — AB

## 2012-04-24 LAB — ABO/RH: ABO/RH(D): O NEG

## 2012-04-24 LAB — HCG, SERUM, QUALITATIVE: Preg, Serum: NEGATIVE

## 2012-04-24 NOTE — Pre-Procedure Instructions (Signed)
20 Bethany Michael  04/24/2012   Your procedure is scheduled on:  Thursday May 03, 2012  Report to [facility] at 10:00 AM.  Call this number if you have problems the morning of surgery: [phone]   Remember:   Do not eat food or drink :After Midnight.    Take these medicines the morning of surgery with A SIP OF WATER: decadron, hydrocodone,     Do not wear jewelry, make-up or nail polish.  Do not wear lotions, powders, or perfumes.   Do not shave 48 hours prior to surgery. Men may shave face and neck.  Do not bring valuables to the hospital.  Contacts, dentures or bridgework may not be worn into surgery.  Leave suitcase in the car. After surgery it may be brought to your room.  For patients admitted to the hospital, checkout time is 11:00 AM the day of discharge.   Patients discharged the day of surgery will not be allowed to drive home.  Name and phone number of your driver: family / friend  Special Instructions: Shower using CHG 2 nights before surgery and the night before surgery.  If you shower the day of surgery use CHG.  Use special wash - you have one bottle of CHG for all showers.  You should use approximately 1/3 of the bottle for each shower.   Please read over the following fact sheets that you were given: Pain Booklet, Coughing and Deep Breathing, MRSA Information and Surgical Site Infection Prevention

## 2012-04-30 ENCOUNTER — Ambulatory Visit (HOSPITAL_COMMUNITY): Admission: RE | Admit: 2012-04-30 | Payer: Medicaid Other | Source: Ambulatory Visit

## 2012-05-02 MED ORDER — CEFAZOLIN SODIUM-DEXTROSE 2-3 GM-% IV SOLR
2.0000 g | INTRAVENOUS | Status: AC
  Administered 2012-05-03: 2 g via INTRAVENOUS
  Filled 2012-05-02: qty 50

## 2012-05-03 ENCOUNTER — Encounter (HOSPITAL_COMMUNITY)
Admission: RE | Disposition: A | Discharge status: Home / Self Care | Payer: Self-pay | Source: Ambulatory Visit | Attending: General Surgery

## 2012-05-03 ENCOUNTER — Encounter (HOSPITAL_COMMUNITY): Payer: Self-pay | Admitting: *Deleted

## 2012-05-03 ENCOUNTER — Ambulatory Visit (HOSPITAL_COMMUNITY): Payer: Medicaid Other | Admitting: *Deleted

## 2012-05-03 ENCOUNTER — Encounter (HOSPITAL_COMMUNITY): Payer: Self-pay | Admitting: Surgery

## 2012-05-03 ENCOUNTER — Encounter (HOSPITAL_COMMUNITY)
Admission: RE | Admit: 2012-05-03 | Discharge: 2012-05-03 | Disposition: A | Discharge status: Home / Self Care | Payer: Medicaid Other | Source: Ambulatory Visit | Attending: General Surgery | Admitting: General Surgery

## 2012-05-03 ENCOUNTER — Ambulatory Visit (HOSPITAL_COMMUNITY)
Admission: RE | Admit: 2012-05-03 | Discharge: 2012-05-04 | Disposition: A | Discharge status: Home / Self Care | Payer: Medicaid Other | Source: Ambulatory Visit | Attending: General Surgery | Admitting: General Surgery

## 2012-05-03 DIAGNOSIS — N6019 Diffuse cystic mastopathy of unspecified breast: Secondary | ICD-10-CM

## 2012-05-03 DIAGNOSIS — C50919 Malignant neoplasm of unspecified site of unspecified female breast: Secondary | ICD-10-CM | POA: Insufficient documentation

## 2012-05-03 DIAGNOSIS — R920 Mammographic microcalcification found on diagnostic imaging of breast: Secondary | ICD-10-CM

## 2012-05-03 DIAGNOSIS — D0590 Unspecified type of carcinoma in situ of unspecified breast: Secondary | ICD-10-CM | POA: Insufficient documentation

## 2012-05-03 DIAGNOSIS — N6029 Fibroadenosis of unspecified breast: Secondary | ICD-10-CM | POA: Insufficient documentation

## 2012-05-03 DIAGNOSIS — C773 Secondary and unspecified malignant neoplasm of axilla and upper limb lymph nodes: Secondary | ICD-10-CM | POA: Insufficient documentation

## 2012-05-03 DIAGNOSIS — Z9013 Acquired absence of bilateral breasts and nipples: Secondary | ICD-10-CM

## 2012-05-03 DIAGNOSIS — C50912 Malignant neoplasm of unspecified site of left female breast: Secondary | ICD-10-CM

## 2012-05-03 HISTORY — DX: Acquired absence of bilateral breasts and nipples: Z90.13

## 2012-05-03 HISTORY — PX: TOTAL MASTECTOMY: SHX6129

## 2012-05-03 HISTORY — PX: MASTECTOMY WITH AXILLARY LYMPH NODE DISSECTION: SHX5661

## 2012-05-03 SURGERY — MASTECTOMY WITH AXILLARY LYMPH NODE DISSECTION
Anesthesia: General | Site: Breast | Laterality: Left | Wound class: Clean

## 2012-05-03 MED ORDER — MORPHINE SULFATE 2 MG/ML IJ SOLN
2.0000 mg | INTRAMUSCULAR | Status: DC | PRN
  Administered 2012-05-03: 2 mg via INTRAVENOUS
  Filled 2012-05-03: qty 1

## 2012-05-03 MED ORDER — ONDANSETRON HCL 4 MG/2ML IJ SOLN
INTRAMUSCULAR | Status: DC | PRN
  Administered 2012-05-03 (×2): 4 mg via INTRAVENOUS

## 2012-05-03 MED ORDER — MIDAZOLAM HCL 2 MG/2ML IJ SOLN
INTRAMUSCULAR | Status: AC
  Filled 2012-05-03: qty 2

## 2012-05-03 MED ORDER — DEXAMETHASONE SODIUM PHOSPHATE 4 MG/ML IJ SOLN
INTRAMUSCULAR | Status: DC | PRN
  Administered 2012-05-03: 4 mg via INTRAVENOUS

## 2012-05-03 MED ORDER — FENTANYL CITRATE 0.05 MG/ML IJ SOLN: 50.0000 ug | Freq: Once | INTRAMUSCULAR | Status: DC

## 2012-05-03 MED ORDER — 0.9 % SODIUM CHLORIDE (POUR BTL) OPTIME
TOPICAL | Status: DC | PRN
  Administered 2012-05-03: 1000 mL

## 2012-05-03 MED ORDER — MIDAZOLAM HCL 2 MG/2ML IJ SOLN
1.0000 mg | INTRAMUSCULAR | Status: DC | PRN
  Administered 2012-05-03: 2 mg via INTRAVENOUS

## 2012-05-03 MED ORDER — ARTIFICIAL TEARS OP OINT
TOPICAL_OINTMENT | OPHTHALMIC | Status: DC | PRN
  Administered 2012-05-03: 1 via OPHTHALMIC

## 2012-05-03 MED ORDER — HYDROMORPHONE HCL PF 1 MG/ML IJ SOLN: 0.2500 mg | INTRAMUSCULAR | Status: DC | PRN

## 2012-05-03 MED ORDER — GLYCOPYRROLATE 0.2 MG/ML IJ SOLN
INTRAMUSCULAR | Status: DC | PRN
  Administered 2012-05-03: 0.4 mg via INTRAVENOUS

## 2012-05-03 MED ORDER — PROPOFOL 10 MG/ML IV BOLUS
INTRAVENOUS | Status: DC | PRN
  Administered 2012-05-03: 30 mg via INTRAVENOUS
  Administered 2012-05-03: 200 mg via INTRAVENOUS

## 2012-05-03 MED ORDER — ONDANSETRON HCL 4 MG/2ML IJ SOLN
4.0000 mg | Freq: Four times a day (QID) | INTRAMUSCULAR | Status: DC | PRN
  Administered 2012-05-03 – 2012-05-04 (×2): 4 mg via INTRAVENOUS
  Filled 2012-05-03: qty 2

## 2012-05-03 MED ORDER — HYDROMORPHONE HCL PF 1 MG/ML IJ SOLN
INTRAMUSCULAR | Status: DC | PRN
  Administered 2012-05-03 (×2): 0.5 mg via INTRAVENOUS

## 2012-05-03 MED ORDER — CEFAZOLIN SODIUM 1-5 GM-% IV SOLN
1.0000 g | Freq: Four times a day (QID) | INTRAVENOUS | Status: AC
  Administered 2012-05-04 (×2): 1 g via INTRAVENOUS
  Filled 2012-05-03 (×3): qty 50

## 2012-05-03 MED ORDER — ONDANSETRON HCL 4 MG/2ML IJ SOLN
INTRAMUSCULAR | Status: AC
  Administered 2012-05-03: 4 mg via INTRAVENOUS
  Filled 2012-05-03: qty 2

## 2012-05-03 MED ORDER — NEOSTIGMINE METHYLSULFATE 1 MG/ML IJ SOLN
INTRAMUSCULAR | Status: DC | PRN
  Administered 2012-05-03: 3 mg via INTRAVENOUS

## 2012-05-03 MED ORDER — FENTANYL CITRATE 0.05 MG/ML IJ SOLN
INTRAMUSCULAR | Status: AC
  Filled 2012-05-03: qty 2

## 2012-05-03 MED ORDER — ONDANSETRON HCL 4 MG PO TABS: 4.0000 mg | ORAL_TABLET | Freq: Four times a day (QID) | ORAL | Status: DC | PRN

## 2012-05-03 MED ORDER — FENTANYL CITRATE 0.05 MG/ML IJ SOLN
INTRAMUSCULAR | Status: DC | PRN
  Administered 2012-05-03 (×2): 100 ug via INTRAVENOUS
  Administered 2012-05-03: 50 ug via INTRAVENOUS
  Administered 2012-05-03 (×2): 100 ug via INTRAVENOUS

## 2012-05-03 MED ORDER — LACTATED RINGERS IV SOLN
INTRAVENOUS | Status: DC
  Administered 2012-05-03: 12:00:00 via INTRAVENOUS

## 2012-05-03 MED ORDER — MIDAZOLAM HCL 5 MG/5ML IJ SOLN
INTRAMUSCULAR | Status: DC | PRN
  Administered 2012-05-03: 2 mg via INTRAVENOUS

## 2012-05-03 MED ORDER — KCL IN DEXTROSE-NACL 20-5-0.9 MEQ/L-%-% IV SOLN
INTRAVENOUS | Status: DC
  Administered 2012-05-03 – 2012-05-04 (×2): via INTRAVENOUS
  Filled 2012-05-03 (×4): qty 1000

## 2012-05-03 MED ORDER — LACTATED RINGERS IV SOLN
INTRAVENOUS | Status: DC | PRN
  Administered 2012-05-03 (×2): via INTRAVENOUS

## 2012-05-03 MED ORDER — TECHNETIUM TC 99M SULFUR COLLOID FILTERED
1.0000 | Freq: Once | INTRAVENOUS | Status: AC | PRN
  Administered 2012-05-03: 1 via INTRADERMAL

## 2012-05-03 MED ORDER — PHENYLEPHRINE HCL 10 MG/ML IJ SOLN
INTRAMUSCULAR | Status: DC | PRN
  Administered 2012-05-03 (×6): 40 ug via INTRAVENOUS

## 2012-05-03 MED ORDER — FENTANYL CITRATE 0.05 MG/ML IJ SOLN
50.0000 ug | Freq: Once | INTRAMUSCULAR | Status: AC
  Administered 2012-05-03: 100 ug via INTRAVENOUS

## 2012-05-03 MED ORDER — ROCURONIUM BROMIDE 100 MG/10ML IV SOLN
INTRAVENOUS | Status: DC | PRN
  Administered 2012-05-03: 10 mg via INTRAVENOUS
  Administered 2012-05-03: 40 mg via INTRAVENOUS

## 2012-05-03 MED ORDER — OXYCODONE-ACETAMINOPHEN 5-325 MG PO TABS: 1.0000 | ORAL_TABLET | ORAL | Status: DC | PRN

## 2012-05-03 SURGICAL SUPPLY — 43 items
BINDER BREAST LRG (GAUZE/BANDAGES/DRESSINGS) ×3 IMPLANT
BINDER BREAST XLRG (GAUZE/BANDAGES/DRESSINGS) IMPLANT
CHLORAPREP W/TINT 26ML (MISCELLANEOUS) ×3 IMPLANT
CLOTH BEACON ORANGE TIMEOUT ST (SAFETY) ×3 IMPLANT
CONT SPECI 4OZ STER CLIK (MISCELLANEOUS) ×12 IMPLANT
COVER PROBE W GEL 5X96 (DRAPES) ×3 IMPLANT
COVER SURGICAL LIGHT HANDLE (MISCELLANEOUS) ×3 IMPLANT
DERMABOND ADHESIVE PROPEN (GAUZE/BANDAGES/DRESSINGS) ×2
DERMABOND ADVANCED (GAUZE/BANDAGES/DRESSINGS) ×1
DERMABOND ADVANCED .7 DNX12 (GAUZE/BANDAGES/DRESSINGS) ×2 IMPLANT
DERMABOND ADVANCED .7 DNX6 (GAUZE/BANDAGES/DRESSINGS) ×4 IMPLANT
DRAIN CHANNEL 19F RND (DRAIN) ×6 IMPLANT
DRAPE PED LAPAROTOMY (DRAPES) ×3 IMPLANT
DRAPE PROXIMA HALF (DRAPES) ×6 IMPLANT
DRESSING TELFA 8X3 (GAUZE/BANDAGES/DRESSINGS) ×6 IMPLANT
DRSG PAD ABDOMINAL 8X10 ST (GAUZE/BANDAGES/DRESSINGS) IMPLANT
ELECT REM PT RETURN 9FT ADLT (ELECTROSURGICAL) ×3
ELECTRODE REM PT RTRN 9FT ADLT (ELECTROSURGICAL) ×2 IMPLANT
EVACUATOR SILICONE 100CC (DRAIN) ×6 IMPLANT
GLOVE BIOGEL PI IND STRL 6.5 (GLOVE) ×2 IMPLANT
GLOVE BIOGEL PI IND STRL 8 (GLOVE) ×4 IMPLANT
GLOVE BIOGEL PI INDICATOR 6.5 (GLOVE) ×1
GLOVE BIOGEL PI INDICATOR 8 (GLOVE) ×2
GLOVE ECLIPSE 8.0 STRL XLNG CF (GLOVE) IMPLANT
GLOVE SURG SS PI 6.0 STRL IVOR (GLOVE) ×3 IMPLANT
GLOVE SURG SS PI 8.0 STRL IVOR (GLOVE) ×6 IMPLANT
GOWN STRL NON-REIN LRG LVL3 (GOWN DISPOSABLE) ×9 IMPLANT
KIT BASIN OR (CUSTOM PROCEDURE TRAY) ×3 IMPLANT
KIT ROOM TURNOVER OR (KITS) ×3 IMPLANT
NS IRRIG 1000ML POUR BTL (IV SOLUTION) ×3 IMPLANT
PACK GENERAL/GYN (CUSTOM PROCEDURE TRAY) ×3 IMPLANT
PAD ARMBOARD 7.5X6 YLW CONV (MISCELLANEOUS) ×3 IMPLANT
SPECIMEN JAR MEDIUM (MISCELLANEOUS) ×3 IMPLANT
SPONGE GAUZE 4X4 12PLY (GAUZE/BANDAGES/DRESSINGS) ×3 IMPLANT
SUT ETHILON 2 0 FS 18 (SUTURE) ×6 IMPLANT
SUT ETHILON 3 0 PS 1 (SUTURE) ×3 IMPLANT
SUT MNCRL AB 3-0 PS2 18 (SUTURE) ×6 IMPLANT
SUT MON AB 4-0 PC3 18 (SUTURE) ×6 IMPLANT
SUT VIC AB 3-0 SH 18 (SUTURE) ×12 IMPLANT
TAPE CLOTH SURG 6X10 WHT LF (GAUZE/BANDAGES/DRESSINGS) ×3 IMPLANT
TOWEL OR 17X24 6PK STRL BLUE (TOWEL DISPOSABLE) ×3 IMPLANT
TOWEL OR 17X26 10 PK STRL BLUE (TOWEL DISPOSABLE) ×3 IMPLANT
WATER STERILE IRR 1000ML POUR (IV SOLUTION) ×3 IMPLANT

## 2012-05-03 NOTE — Op Note (Signed)
Operative Note  Bethany Michael female 51 y.o. 05/03/2012  PREOPERATIVE DX:  Left Breast Cancer  POSTOPERATIVE DX:  Same  PROCEDURE: Left axillary lymphatic mapping. Bilateral mastectomies. Left axillary sentinel lymph node biopsy (4 lymph nodes).         Surgeon: Adolph Pollack   Assistants: Harriette Bouillon, M.D.  Anesthesia: General endotracheal anesthesia  Indications:   This is a 51 year old female with a long-standing left breast mass. She finally sought attention. Evaluation was consistent with a large left breast cancer. She underwent neoadjuvant chemotherapy with good results. After giving her surgical options she chose bilateral mastectomies and sentinel lymph node biopsy; she presents for that.    Procedure Detail:  She had successful injection of radioactive material in the left breast. She was seen in the holding room and the left breast was marked with my initials. She is brought to the operating room placed supine on the operating table and a general anesthetic was administered. Left axillary lymphatic mapping was perform with the neoprobe and an area of increased counts was noted.  Bilateral chest walls, axillae, and necks were sterilely prepped and draped.  I began on the right side. An elliptical incision was made through the skin of the breast around the area of the nipple. Subcutaneous flaps were then raised using electrocautery. Flaps were raised superiorly to the clavicle and avoiding the Port-A-Cath, medially to the sternum, inferiorly to the anterior rectus sheath, and laterally to the latissimus muscle. Using electrocautery I then dissected the breast tissue off the pectoralis major muscle and fascia. The medial aspect of the breast tissue was marked with a suture and it was sent to pathology.  Was irrigated. The wound was inspected and hemostasis was controlled with electrocautery. Once hemostasis was adequate I made a stab incision in the inferior flap and placed  a 19 Blake drain into the wound. It was anchored to the skin with 3-0 nylon suture. The subcutaneous tissue the wound was then approximated with interrupted 3-0 Vicryl sutures. The skin was closed with a 3-0 Monocryl subcuticular stitch.  Next the left axilla was approached. An inferior transverse incision was made through the skin and subcutaneous tissue. The axillary content area was entered. Using the neoprobe an area of increased counts was identified and identified a lymph node. This was removed.  I identified 3 other lymph nodes with increased counts and these were all removed. This was a total of 4 sentinel lymph nodes. No further increase counts were noted. The lymph nodes were sent to pathology. It was inspected and hemostasis was adequate. The subcutaneous tissues approximated with interrupted 3-0 Vicryl sutures. The skin was closed with a running 4-0 Monocryl subcuticular stitch.  Finally, the left breast was approached. An elliptical incision was made through the skin to include the nipple. Subcutaneous flaps were raised superiorly to the clavicle, medially to the lateral border of the sternum, inferiorly to the anterior rectus sheath, and laterally to the latissimus muscle. The breast tissues are dissected off the pectoralis major muscle and fascia with electrocautery. The medial aspect was marked with a suture and this was sent to pathology.  The wound is irrigated. The wound was inspected and bleeding was controlled with electrocautery. Once hemostasis was adequate a stab incision was made in the inferior flap. A 19 Blake drain was placed into the wound. It was anchored to the skin with 3-0 nylon suture. The subcutaneous tissues and approximated with interrupted 3-0 Vicryl suture. The skin is closed with a  running 3-0 Monocryl subcuticular stitch. Dermabond and sterile dressings were applied to all wounds. A breast binder was applied.  Estimated Blood Loss:  200 mL         Drains:  JACKSON-PRATT (JP)  Blood Given: none          Specimens: Right breast, left breast, sentinel lymph nodes.        Complications:  * No complications entered in OR log *         Disposition: PACU - hemodynamically stable.         Condition: stable

## 2012-05-03 NOTE — Preoperative (Signed)
Beta Blockers   Reason not to administer Beta Blockers:Not Applicable 

## 2012-05-03 NOTE — H&P (Signed)
Bethany Michael is an 51 y.o. female.   Chief Complaint: Here for elective breast surgery HPI:  She noticed a small left breast mass at least 5 years ago and presented to her PCP because of that. A plan of action was suggested but she did not go through with it because her husband was diagnosed with prostate cancer at the time. The mass has been getting larger and show noticed some transient left arm swelling. The arm swelling resolved but it prompted her to seek medical attention for the mass. She underwent a mammogram and [place] which demonstrated a 3.8 cm left breast mass. There were also some areas of concern in the right breast. 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 Proliferation rate is 67%. MRI demonstrated the left breast masses as well as worrisome areas in the right breast. These were benign. She has undergone neoadjuvant chemotherapy with a good result and that the mass is shrinking.  She now presents for bilateral mastectomies and left axillary sentinel lymph node biopsy   Past Medical History  Diagnosis Date  . Cancer     breast  . Family history of anesthesia complication     Father "affected cognition"  . Hyperthyroidism   . Kidney stone     presently    Past Surgical History  Procedure Date  . Cesarean section     3 previous  . Portacath placement 11/17/2011    Procedure: INSERTION PORT-A-CATH;  Surgeon: Adolph Pollack, MD;  Location: [facility];  Service: General;  Laterality: N/A;  ultrasound guided Portacath insertion right side  . Doppler echocardiography     2013    Family History  Problem Relation Age of Onset  . Diabetes Mother    Social History:  reports that she has never smoked. She has never used smokeless tobacco. She reports that she drinks alcohol. She reports that she does not use illicit drugs.  Allergies:  Allergies  Allergen Reactions  . Latex Hives and Rash     Medications Prior to Admission  Medication Sig Dispense Refill  . HYDROcodone-acetaminophen (NORCO/VICODIN) 5-325 MG per tablet Take 1 tablet by mouth every 6 (six) hours as needed. For pain      . sennosides-docusate sodium (SENOKOT-S) 8.6-50 MG tablet Take 1 tablet by mouth as needed. For constipation        No results found for this or any previous visit (from the past 48 hour(s)). No results found.  Review of Systems  Constitutional: Negative for fever and chills.  Gastrointestinal: Negative for nausea, vomiting and diarrhea.    Blood pressure 101/63, pulse 72, temperature 98.1 F (36.7 C), temperature source Oral, resp. rate 18, SpO2 99.00%. Physical Exam  Constitutional: She appears well-developed and well-nourished. No distress.  HENT:  Head: Normocephalic and atraumatic.       Alopecia is present  Neck: Neck supple.  Cardiovascular: Normal rate and regular rhythm.   Respiratory: Effort normal and breath sounds normal.       Right upper chest wall Port-A-Cath. No right breast masses.  Palpable left breast mass.  GI: Soft. There is no tenderness.  Musculoskeletal: She exhibits no edema.  Lymphadenopathy:    She has no cervical adenopathy.     Assessment/Plan Invasive left breast cancer status post neoadjuvant chemotherapy.  Plan: Bilateral mastectomies and left axillary sentinel lymph node biopsy. The procedure, risks, and aftercare were discussed with her preoperatively.  Anays Detore  J 05/03/2012, 12:11 PM

## 2012-05-03 NOTE — Interval H&P Note (Signed)
History and Physical Interval Note:  05/03/2012 12:14 PM  Bethany Michael  has presented today for surgery, with the diagnosis of left breast cancer  The various methods of treatment have been discussed with the patient and family. After consideration of risks, benefits and other options for treatment, the patient has consented to  Procedure(s) (LRB) with comments: MASTECTOMY WITH AXILLARY LYMPH NODE DISSECTION (Left) - bilateral mastectomies with sentinel lymph node dissection TOTAL MASTECTOMY (Right) as a surgical intervention .  The patient's history has been reviewed, patient examined, no change in status, stable for surgery.  I have reviewed the patient's chart and labs.  Questions were answered to the patient's satisfaction.     [name] [facility]

## 2012-05-03 NOTE — Anesthesia Preprocedure Evaluation (Addendum)
Anesthesia Evaluation  Patient identified by MRN, date of birth, ID band Patient awake    Reviewed: Allergy & Precautions, H&P , NPO status   Airway Mallampati: II      Dental   Pulmonary neg pulmonary ROS,  breath sounds clear to auscultation        Cardiovascular negative cardio ROS  Rhythm:Regular Rate:Normal     Neuro/Psych negative neurological ROS     GI/Hepatic negative GI ROS, Neg liver ROS,   Endo/Other  Hyperthyroidism   Renal/GU History of kidney stone.     Musculoskeletal   Abdominal   Peds  Hematology negative hematology ROS (+)   Anesthesia Other Findings   Reproductive/Obstetrics                          Anesthesia Physical Anesthesia Plan  ASA: III  Anesthesia Plan: General   Post-op Pain Management:    Induction: Intravenous  Airway Management Planned: Oral ETT  Additional Equipment:   Intra-op Plan:   Post-operative Plan: Extubation in OR  Informed Consent: I have reviewed the patients History and Physical, chart, labs and discussed the procedure including the risks, benefits and alternatives for the proposed anesthesia with the patient or authorized representative who has indicated his/her understanding and acceptance.     Plan Discussed with: CRNA, Anesthesiologist and Surgeon  Anesthesia Plan Comments:         Anesthesia Quick Evaluation

## 2012-05-03 NOTE — Anesthesia Postprocedure Evaluation (Signed)
  Anesthesia Post-op Note  Patient: Bethany Michael  Procedure(s) Performed: Procedure(s) (LRB) with comments: MASTECTOMY WITH AXILLARY LYMPH NODE DISSECTION (Left) - bilateral mastectomies with left axillary sentinel lymph node dissection TOTAL MASTECTOMY (Bilateral) - bilateral mastectomies with left axillary sentinel lymph node dissection  Patient Location: PACU  Anesthesia Type:General  Level of Consciousness: awake, alert  and oriented  Airway and Oxygen Therapy: Patient Spontanous Breathing  Post-op Pain: none  Post-op Assessment: Post-op Vital signs reviewed, Patient's Cardiovascular Status Stable, Respiratory Function Stable, No signs of Nausea or vomiting and Pain level controlled  Post-op Vital Signs: stable  Complications: No apparent anesthesia complications

## 2012-05-03 NOTE — Transfer of Care (Signed)
Immediate Anesthesia Transfer of Care Note  Patient: Bethany Michael  Procedure(s) Performed: Procedure(s) (LRB) with comments: MASTECTOMY WITH AXILLARY LYMPH NODE DISSECTION (Left) - bilateral mastectomies with left axillary sentinel lymph node dissection TOTAL MASTECTOMY (Bilateral) - bilateral mastectomies with left axillary sentinel lymph node dissection  Patient Location: PACU  Anesthesia Type:General  Level of Consciousness: patient cooperative and responds to stimulation  Airway & Oxygen Therapy: Patient Spontanous Breathing and Patient connected to nasal cannula oxygen  Post-op Assessment: Report given to PACU RN, Post -op Vital signs reviewed and stable and Patient moving all extremities X 4  Post vital signs: Reviewed and stable  Complications: No apparent anesthesia complications

## 2012-05-04 ENCOUNTER — Encounter (HOSPITAL_COMMUNITY): Payer: Self-pay | Admitting: General Surgery

## 2012-05-04 MED ORDER — ONDANSETRON HCL 4 MG PO TABS: 4.0000 mg | ORAL_TABLET | ORAL | Status: DC | PRN

## 2012-05-04 MED ORDER — BOOST / RESOURCE BREEZE PO LIQD: 1.0000 | Freq: Three times a day (TID) | ORAL | Status: DC

## 2012-05-04 NOTE — Progress Notes (Signed)
Patient discharged to home in care of family. Medication and instructions reviewed with patient and all questions answered. IV d/c'd with cath intact and dressing CDI. Drain care discussed with patient with return demonstration done approprietly. Assessment unchanged from this am. Patient is to follow up with Dr. Abbey Chatters on 12.10.13.

## 2012-05-04 NOTE — Progress Notes (Signed)
She feels better and tolerated her lunch.  Has mild nausea.  She wants to go home.  Will discharge.  Instructions given to her.

## 2012-05-04 NOTE — Progress Notes (Signed)
1 Day Post-Op  Subjective: Having some nausea and vomiting.  Sore.  Objective: Vital signs in last 24 hours: Temp:  [97.2 F (36.2 C)-98.2 F (36.8 C)] 98.2 F (36.8 C) (12/06 0639) Pulse Rate:  [61-102] 102  (12/06 0639) Resp:  [6-19] 10  (12/05 1645) BP: (83-144)/(40-78) 97/40 mmHg (12/06 0639) SpO2:  [94 %-100 %] 100 % (12/06 0639) Weight:  [158 lb 15.2 oz (72.1 kg)] 158 lb 15.2 oz (72.1 kg) (12/05 1711) Last BM Date: 05/03/12  Intake/Output from previous day: 12/05 0701 - 12/06 0700 In: 2682 [I.V.:2660] Out: 1150 [Urine:1040; Drains:110] Intake/Output this shift: Total I/O In: 1260 [I.V.:1260] Out: 1150 [Urine:1040; Drains:110]  PE: General- In NAD Chest-flaps are flat, serosanguinous drain output  Lab Results:  No results found for this basename: WBC:2,HGB:2,HCT:2,PLT:2 in the last 72 hours BMET No results found for this basename: NA:2,K:2,CL:2,CO2:2,GLUCOSE:2,BUN:2,CREATININE:2,CALCIUM:2 in the last 72 hours PT/INR No results found for this basename: LABPROT:2,INR:2 in the last 72 hours Comprehensive Metabolic Panel:    Component Value Date/Time   NA 137 04/24/2012 1252   K 4.3 04/24/2012 1252   CL 103 04/24/2012 1252   CO2 27 04/24/2012 1252   BUN 12 04/24/2012 1252   CREATININE 0.52 04/24/2012 1252   GLUCOSE 91 04/24/2012 1252   CALCIUM 9.5 04/24/2012 1252   AST 15 04/24/2012 1252   ALT 6 04/24/2012 1252   ALKPHOS 63 04/24/2012 1252   BILITOT 0.3 04/24/2012 1252   PROT 5.8* 04/24/2012 1252   ALBUMIN 3.0* 04/24/2012 1252     Studies/Results: Nm Sentinel Node Inj-no Rpt (breast)  05/03/2012  CLINICAL DATA: left breast cancer   Sulfur colloid was injected intradermally by the nuclear medicine  technologist for breast cancer sentinel node localization.      Anti-infectives: Anti-infectives     Start     Dose/Rate Route Frequency Ordered Stop   05/03/12 1800   ceFAZolin (ANCEF) IVPB 1 g/50 mL premix        1 g 100 mL/hr over 30 Minutes Intravenous  Every 6 hours 05/03/12 1714 05/04/12 1159   05/03/12 0600   ceFAZolin (ANCEF) IVPB 2 g/50 mL premix        2 g 100 mL/hr over 30 Minutes Intravenous On call to O.R. 05/02/12 1415 05/03/12 1232          Assessment Active Problems:  Breast cancer-left breast s/p bilateral mastectomies and left axillary SLNBx on 05/03/12-having some post op n/v otherwise doing okay.    LOS: 1 day   Plan: Zofran as needed.  Continue IVF.  Will see how see is doing this afternoon.   Bethany Michael J 05/04/2012

## 2012-05-04 NOTE — Progress Notes (Addendum)
Initial Nutrition Assessment  DOCUMENTATION CODES Per approved criteria  -Not Applicable   INTERVENTION:  Resource Breeze supplement 3 times daily (250 kcals, 9 gm protein per 8 fl oz carton) RD to follow for nutrition care plan  NUTRITION DIAGNOSIS: Inadequate oral intake related to poor appetite, N/V as evidenced by patient report  Goal: Oral intake with meals & supplements to meet >/= 90% of estimated nutrition needs  Monitor:  PO & supplemental intake, weight, labs, I/O's  Reason for Assessment: Malnutrition Screening Tool Report  ASSESSMENT: Patient s/p left axillary lymphatic mapping, bilateral mastectomies 12/5; has undergone neoadjuvant chemotherapy; reports nausea at time of RD visitation; no % meals recorded per flowsheet records; also endorses a 30 lb weight loss (16%) since starting chemotherapy (June 2013); patient reports no problems with her PO intake in the past several months; 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  51 y.o. female  Admitting Dx: invasive left breast cancer   Ht Readings from Last 1 Encounters:  05/03/12 5\' 2"  (1.575 m)    Wt Readings from Last 1 Encounters:  05/03/12 158 lb 15.2 oz (72.1 kg)    Wt Readings from Last 10 Encounters:  05/03/12 158 lb 15.2 oz (72.1 kg)  05/03/12 158 lb 15.2 oz (72.1 kg)  04/24/12 144 lb 10 oz (65.6 kg)  03/26/12 140 lb 3.2 oz (63.594 kg)  03/20/12 146 lb 3.2 oz (66.316 kg)  03/05/12 142 lb (64.411 kg)  02/20/12 146 lb (66.225 kg)  02/17/12 149 lb 9.6 oz (67.858 kg)  01/31/12 151 lb 12.8 oz (68.856 kg)  01/17/12 152 lb 6.4 oz (69.128 kg)    Ideal Body Weight: 50 kg  % Ideal Body Weight: 69%  % Usual Body Weight: 81%  BMI:  Body mass index is 29.07 kg/(m^2).  Estimated Nutritional Needs: Kcal: 1800-2000 Protein: 90-100 gm Fluid: 1.8-2.0 L  Skin: Intact  Diet Order: General  EDUCATION NEEDS: -No education needs identified at this time   Intake/Output  Summary (Last 24 hours) at 05/04/12 1159 Last data filed at 05/04/12 0648  Gross per 24 hour  Intake   2682 ml  Output   1150 ml  Net   1532 ml    Labs:  No results found for this basename: NA:3,K:3,CL:3,CO2:3,BUN:3,CREATININE:3,CALCIUM:3,MG:3,PHOS:3,GLUCOSE:3 in the last 168 hours   Scheduled Meds:   . [EXPIRED]  ceFAZolin (ANCEF) IV  1 g Intravenous Q6H  . [COMPLETED]  ceFAZolin (ANCEF) IV  2 g Intravenous On Call to OR  . [COMPLETED] fentaNYL  50-100 mcg Intravenous Once  . [COMPLETED] ondansetron      . [DISCONTINUED] fentaNYL  50-100 mcg Intravenous Once   Continuous Infusions:   . dextrose 5 % and 0.9 % NaCl with KCl 20 mEq/L 100 mL/hr at 05/04/12 0714  . [DISCONTINUED] lactated ringers 50 mL/hr at 05/03/12 1137    Past Medical History  Diagnosis Date  . Cancer     breast  . Family history of anesthesia complication     Father "affected cognition"  . Hyperthyroidism   . Kidney stone     presently    Past Surgical History  Procedure Date  . Cesarean section     3 previous  . Portacath placement 11/17/2011    Procedure: INSERTION PORT-A-CATH;  Surgeon: Adolph Pollack, MD;  Location: [facility];  Service: General;  Laterality: N/A;  ultrasound guided Portacath insertion right side  . Doppler echocardiography     2013  Kirkland Hun, RD, LDN Pager #: [phone] After-Hours Pager #: [phone]

## 2012-05-08 ENCOUNTER — Ambulatory Visit (INDEPENDENT_AMBULATORY_CARE_PROVIDER_SITE_OTHER): Payer: Medicaid Other | Admitting: General Surgery

## 2012-05-08 ENCOUNTER — Encounter (INDEPENDENT_AMBULATORY_CARE_PROVIDER_SITE_OTHER): Payer: Self-pay | Admitting: General Surgery

## 2012-05-08 VITALS — BP 112/68 | HR 76 | Temp 98.5°F | Resp 14 | Ht 67.0 in | Wt 150.4 lb

## 2012-05-08 DIAGNOSIS — Z9889 Other specified postprocedural states: Secondary | ICD-10-CM

## 2012-05-08 NOTE — Patient Instructions (Signed)
Call when drain output is less than 30 cc per day for two days.  Once drains are out, you may shower and resume your normal activities as tolerated as we discussed.

## 2012-05-08 NOTE — Progress Notes (Signed)
Procedure:  Bilateral mastectomies and left axillary sentinel lymph node biopsy  Date:  05/03/09  Pathology:  YT2yN1(mic)  History:  She is here for her first postoperative visit. She has some left axillary soreness. Drain outputs are greater than 30 cc per day. I discussed her pathology with her and gave her a copy of the front sheet.  Exam: General- Is in NAD. Chest-both incisions are clean and intact. Both drains demonstrates serous output.  Assessment:  Doing well postoperatively thus far. Drain output is still too high to move them.  Plan:  She was instructed to call on the drain output is less than 30 cc per day for 2 days. When the drains are removed she may resume her normal activities as tolerated. Formal return visit in one month.

## 2012-05-09 ENCOUNTER — Other Ambulatory Visit (INDEPENDENT_AMBULATORY_CARE_PROVIDER_SITE_OTHER): Payer: Self-pay | Admitting: General Surgery

## 2012-05-09 DIAGNOSIS — C50919 Malignant neoplasm of unspecified site of unspecified female breast: Secondary | ICD-10-CM

## 2012-05-14 ENCOUNTER — Encounter (INDEPENDENT_AMBULATORY_CARE_PROVIDER_SITE_OTHER): Payer: Medicaid Other

## 2012-05-21 ENCOUNTER — Encounter (HOSPITAL_COMMUNITY): Payer: Medicaid Other | Attending: Oncology | Admitting: Oncology

## 2012-05-21 ENCOUNTER — Encounter (HOSPITAL_COMMUNITY): Payer: Self-pay | Admitting: Oncology

## 2012-05-21 VITALS — BP 104/56 | HR 80 | Temp 97.1°F | Resp 14 | Wt 146.3 lb

## 2012-05-21 DIAGNOSIS — C50919 Malignant neoplasm of unspecified site of unspecified female breast: Secondary | ICD-10-CM | POA: Insufficient documentation

## 2012-05-21 LAB — CBC WITH DIFFERENTIAL/PLATELET
HCT: 35.9 % — ABNORMAL LOW (ref 36.0–46.0)
Hemoglobin: 11.5 g/dL — ABNORMAL LOW (ref 12.0–15.0)
Lymphocytes Relative: 34 % (ref 12–46)
Lymphs Abs: 1.6 10*3/uL (ref 0.7–4.0)
Monocytes Relative: 10 % (ref 3–12)
Neutro Abs: 2.4 10*3/uL (ref 1.7–7.7)
Neutrophils Relative %: 49 % (ref 43–77)
RBC: 3.91 MIL/uL (ref 3.87–5.11)

## 2012-05-21 LAB — COMPREHENSIVE METABOLIC PANEL
Albumin: 3.5 g/dL (ref 3.5–5.2)
Alkaline Phosphatase: 62 U/L (ref 39–117)
BUN: 19 mg/dL (ref 6–23)
CO2: 30 mEq/L (ref 19–32)
Chloride: 103 mEq/L (ref 96–112)
GFR calc Af Amer: >90 mL/min (ref >90)
GFR calc non Af Amer: >90 mL/min (ref >90)
Glucose, Bld: 83 mg/dL (ref 70–99)
Potassium: 3.6 mEq/L (ref 3.5–5.1)
Total Bilirubin: 0.3 mg/dL (ref 0.3–1.2)

## 2012-05-21 NOTE — Patient Instructions (Addendum)
[facility]  Discharge Instructions  RECOMMENDATIONS MADE BY THE CONSULTANT AND ANY TEST RESULTS WILL BE SENT TO YOUR REFERRING DOCTOR.   EXAM FINDINGS BY MD TODAY AND SIGNS AND SYMPTOMS TO REPORT TO CLINIC OR PRIMARY MD:    INSTRUCTIONS GIVEN AND DISCUSSED: Labs today  SPECIAL INSTRUCTIONS/FOLLOW-UP: See Tom in 3 months   I acknowledge that I have been informed and understand all the instructions given to me and received a copy. I do not have any more questions at this time, but understand that I may call the Specialty Clinic at [facility] at [phone] during business hours should I have any further questions or need assistance in obtaining follow-up care.    __________________________________________  _____________  __________ Signature of Patient or Authorized Representative            Date                   Time    __________________________________________ Nurse's Signature

## 2012-05-21 NOTE — Progress Notes (Signed)
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 When she presented she had a 4.9 x 4.9 but 3.9 cm mass by MRI criteria with associated skin thickening both radiographically and clinically but no obvious lymphadenopathy. She received dose dense chemotherapy consisting of FEC x6 cycles followed by dose dense Taxotere x4 cycles. She had definitive surgery by Dr. Abbey Chatters on December 5. The right breast which also appeared abnormal by MRI was benign changes only. The left breast at negative margins but within a 3.5 cm area of high-grade DCIS there were focal areas of persistent invasive ductal carcinoma, still high-grade. One of 5 lymph nodes had a micrometastasis only. Once again she was triple negative on the mastectomy specimen as well.  She has recovered very well surgically and her wounds are healing very nicely bilaterally. She does not have a followup appointment with Dr. Basilio Cairo yet and she wants to stick with her for her radiation therapy. I've talked to Dr. Basilio Cairo who will be in touch with her in the very near future.  Overall I think the patient has done very well. I do want to check her blood counts today since she has had intermittent swelling of her legs requiring Lasix and spironolactone in the recent past. I have asked her to stop using those unless it's really pitting edema. We will therefore see her back in 12 weeks sooner if need be

## 2012-05-21 NOTE — Progress Notes (Signed)
Bethany Michael presented for labwork. Labs per MD order drawn via Peripheral Line 25 gauge needle inserted in rt ac.  Good blood return present. Procedure without incident.  Needle removed intact. Patient tolerated procedure well.

## 2012-06-05 ENCOUNTER — Encounter: Payer: Self-pay | Admitting: Radiation Oncology

## 2012-06-06 ENCOUNTER — Ambulatory Visit: Payer: Medicaid Other | Admitting: Radiation Oncology

## 2012-06-06 ENCOUNTER — Ambulatory Visit: Admission: RE | Admit: 2012-06-06 | Payer: Medicaid Other | Source: Ambulatory Visit

## 2012-06-06 ENCOUNTER — Telehealth: Payer: Self-pay | Admitting: *Deleted

## 2012-06-06 ENCOUNTER — Ambulatory Visit: Admission: RE | Admit: 2012-06-06 | Payer: Medicaid Other | Source: Ambulatory Visit | Admitting: Radiation Oncology

## 2012-06-06 DIAGNOSIS — Z510 Encounter for antineoplastic radiation therapy: Secondary | ICD-10-CM | POA: Insufficient documentation

## 2012-06-06 DIAGNOSIS — L988 Other specified disorders of the skin and subcutaneous tissue: Secondary | ICD-10-CM | POA: Insufficient documentation

## 2012-06-06 DIAGNOSIS — L589 Radiodermatitis, unspecified: Secondary | ICD-10-CM | POA: Insufficient documentation

## 2012-06-06 DIAGNOSIS — C50919 Malignant neoplasm of unspecified site of unspecified female breast: Secondary | ICD-10-CM | POA: Insufficient documentation

## 2012-06-06 DIAGNOSIS — Z9010 Acquired absence of unspecified breast and nipple: Secondary | ICD-10-CM | POA: Insufficient documentation

## 2012-06-06 DIAGNOSIS — G609 Hereditary and idiopathic neuropathy, unspecified: Secondary | ICD-10-CM | POA: Insufficient documentation

## 2012-06-06 DIAGNOSIS — Y842 Radiological procedure and radiotherapy as the cause of abnormal reaction of the patient, or of later complication, without mention of misadventure at the time of the procedure: Secondary | ICD-10-CM | POA: Insufficient documentation

## 2012-06-06 HISTORY — DX: Personal history of antineoplastic chemotherapy: Z92.21

## 2012-06-06 NOTE — Telephone Encounter (Signed)
Called and left message for Bethany Michael because she did not show for her 10 am appointment wiith Dr. Basilio Cairo.  She was scheduled for  A Nurse Evaluation at 1000, a Consult at 1030, and a simulation at 1100.  Called her mother who is her contact but unable to reach her.

## 2012-06-07 ENCOUNTER — Encounter: Payer: Self-pay | Admitting: *Deleted

## 2012-06-08 ENCOUNTER — Encounter (INDEPENDENT_AMBULATORY_CARE_PROVIDER_SITE_OTHER): Payer: Self-pay | Admitting: General Surgery

## 2012-06-08 ENCOUNTER — Ambulatory Visit (INDEPENDENT_AMBULATORY_CARE_PROVIDER_SITE_OTHER): Payer: Medicaid Other | Admitting: General Surgery

## 2012-06-08 VITALS — BP 98/70 | HR 72 | Temp 99.6°F | Resp 14 | Ht 67.0 in | Wt 141.8 lb

## 2012-06-08 DIAGNOSIS — Z9889 Other specified postprocedural states: Secondary | ICD-10-CM

## 2012-06-08 NOTE — Patient Instructions (Signed)
You may proceed with your radiation treatments.

## 2012-06-08 NOTE — Progress Notes (Signed)
Procedure:  Bilateral mastectomies and left axillary sentinel lymph node biopsy  Date:  05/03/09  Pathology:  YT2yN1(mic)  History:  She is here another postoperative visit.  All drains are out.  She is doing much better overall. Exam: General- Is in NAD. Chest-both incisions are clean and intact. Left axillary incision is clean and intact Extr-Good ROM. Assessment:  Continues to do well postoperatively.  Plan:  Resume normal activities as tolerated. May proceed with radiation treatments. Return visit 3 months.

## 2012-06-28 ENCOUNTER — Encounter: Payer: Self-pay | Admitting: Radiation Oncology

## 2012-06-29 ENCOUNTER — Ambulatory Visit
Admission: RE | Admit: 2012-06-29 | Discharge: 2012-06-29 | Disposition: A | Discharge status: Home / Self Care | Payer: Medicaid Other | Source: Ambulatory Visit | Attending: Radiation Oncology | Admitting: Radiation Oncology

## 2012-06-29 ENCOUNTER — Encounter: Payer: Self-pay | Admitting: Radiation Oncology

## 2012-06-29 ENCOUNTER — Telehealth: Payer: Self-pay | Admitting: *Deleted

## 2012-06-29 VITALS — BP 129/76 | HR 88 | Temp 97.2°F | Resp 20 | Wt 138.9 lb

## 2012-06-29 DIAGNOSIS — C50919 Malignant neoplasm of unspecified site of unspecified female breast: Secondary | ICD-10-CM

## 2012-06-29 DIAGNOSIS — C50912 Malignant neoplasm of unspecified site of left female breast: Secondary | ICD-10-CM | POA: Insufficient documentation

## 2012-06-29 DIAGNOSIS — E041 Nontoxic single thyroid nodule: Secondary | ICD-10-CM | POA: Insufficient documentation

## 2012-06-29 DIAGNOSIS — Z9221 Personal history of antineoplastic chemotherapy: Secondary | ICD-10-CM | POA: Insufficient documentation

## 2012-06-29 DIAGNOSIS — Z9013 Acquired absence of bilateral breasts and nipples: Secondary | ICD-10-CM | POA: Insufficient documentation

## 2012-06-29 DIAGNOSIS — C50119 Malignant neoplasm of central portion of unspecified female breast: Secondary | ICD-10-CM

## 2012-06-29 DIAGNOSIS — N200 Calculus of kidney: Secondary | ICD-10-CM | POA: Insufficient documentation

## 2012-06-29 HISTORY — DX: Presence of other vascular implants and grafts: Z95.828

## 2012-06-29 HISTORY — DX: Malignant neoplasm of unspecified site of left female breast: C50.912

## 2012-06-29 HISTORY — DX: Acquired absence of bilateral breasts and nipples: Z90.13

## 2012-06-29 NOTE — Progress Notes (Addendum)
Radiation Oncology         [phone] ________________________________  Name: Bethany Michael MRN: [id number]  Date: 06/29/2012  DOB: 10/26/1960  Follow-Up Visit Note  Outpatient   Diagnosis:   Left central breast cancer ypT2 N1 mic, triple negative, grade3. (clinically, this was T3, N0, M0)  Narrative:  The patient returns today for follow-up.    I initially saw the patient at [facility]. She returns now for consideration of radiotherapy. She wishes to receive her radiotherapy here. After my consultation with her in June 2013, the patient proceeded with 6 cycles of dose dense chemotherapy, consisting of FEC. This was followed by dose dense Taxotere x4 cycles. She then underwent bilateral mastectomies on 05/03/2012. A right simple mastectomy showed no evidence of malignancy.The left simple mastectomy  demonstrated a 3.5cm stellate area with scattered foci of invasive ductal carcinoma that was grade 3. 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 The surgical margins are clear by at least 0.5 cm to the in situ and invasive disease. 5 sentinel lymph nodes were removed. One of these demonstrated microscopic disease. There was no lymphovascular space invasion. The tumor was unifocal.  Postoperatively, she is doing relatively well.  She complains of  NO pain, or appetite loss. Her husband has prostate cancer. They share 3 children. She is a nonsmoker. She does complain of neuropathy and watering eyes which followed completion of chemotherapy. Little fatigue.  ALLERGIES:  is allergic to latex.  Meds: Current Outpatient Prescriptions  Medication Sig Dispense Refill  . sennosides-docusate sodium (SENOKOT-S) 8.6-50 MG tablet Take 1 tablet by mouth as needed. For constipation        Physical Findings: The patient is in no acute distress. Patient is alert and oriented.  weight is 138 lb 14.4 oz (63.005 kg). Her oral temperature is 97.2 F (36.2 C). Her  blood pressure is 129/76 and her pulse is 88. Her respiration is 20. Marland Kitchen  Sitting comfortably in a chair.  Bilateral mastectomy scars have healed well. No palpable or visual signs of recurrence. No lymphedema in her arms.  Lab Findings: Lab Results  Component Value Date   WBC 4.8 05/21/2012   HGB 11.5* 05/21/2012   HCT 35.9* 05/21/2012   MCV 91.8 05/21/2012   PLT 186 05/21/2012    CMP     Component Value Date/Time   NA 138 05/21/2012 1329   K 3.6 05/21/2012 1329   CL 103 05/21/2012 1329   CO2 30 05/21/2012 1329   GLUCOSE 83 05/21/2012 1329   BUN 19 05/21/2012 1329   CREATININE 0.66 05/21/2012 1329   CALCIUM 9.3 05/21/2012 1329   PROT 6.2 05/21/2012 1329   ALBUMIN 3.5 05/21/2012 1329   AST 13 05/21/2012 1329   ALT 9 05/21/2012 1329   ALKPHOS 62 05/21/2012 1329   BILITOT 0.3 05/21/2012 1329   GFRNONAA >90 05/21/2012 1329   GFRAA >90 05/21/2012 1329      Radiographic Findings: No results found.  Impression/Plan:   This is a lovely patient with a history of Left central breast cancer ypT2 N1 mic, triple negative, grade3. (clinically, this was T3, N0, M0).  She is status post neoadjuvant chemotherapy and bilateral mastectomies with left axillary sentinel lymph node biopsy.  We discussed adjuvant radiotherapy today.  I recommend radiotherapy to the left chest wall, supraclavicular region and axillary region  in order to improve her local regional control; this may carry a small benefit for her overall survival as  well.  The risks, benefits and side effects of this treatment were discussed in detail.  She understands that radiotherapy is associated with skin irritation and fatigue in the acute setting. Late effects can include cosmetic changes and rare injury to internal organs such as the lungs and heart. There is a small increase in her risk of lymphedema as well. She understands that treatment will take place over approximately 6 weeks, 5 days a week. She understands importance  of attending each scheduled treatment without significant breaks..   She is enthusiastic about proceeding with treatment. A consent form has been signed and placed in her chart. We will proceed with treatment planning today.  I spent 20 minutes minutes face to face with the patient and more than 50% of that time was spent in counseling and/or coordination of care. _____________________________________   [name], MD

## 2012-06-29 NOTE — Telephone Encounter (Signed)
Called upstairs,patient not in lobby,called patient cell phone same as home phone,left message to call us ,asking if she qwas on her way for this appt. 8:41 AM

## 2012-06-29 NOTE — Progress Notes (Addendum)
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

## 2012-06-29 NOTE — Progress Notes (Signed)
Please see the Nurse Progress Note in the MD Initial Consult Encounter for this patient. 

## 2012-06-29 NOTE — Progress Notes (Addendum)
Simulation / Treatment Planning Note OUTPATIENT  The patient has a diagnosis of  Left breast cancer; she is status post mastectomy, SLN bx. She will receive adjuvant radiotherapy. The patient was laid in the supine position on the treatment table with her arms over her head. Her head was in an Accuform device. I placed adhesive wiring over her lumpectomy scar and around the borders of her breast tissue . High-resolution CT axial imaging was obtained of the patient's chest. An isocenter was placed in her anterior left lung. Skin markings were made and she tolerated the procedure well without any complications.   RESPIRATORY MOTION MANAGEMENT SIMULATION The patient was then scanned in the CT simulator while holding her breath.   In order to account for effect of respiratory motion on target structures and other organs in the planning and delivery of radiotherapy, 4D respiratory motion management CT images were obtained.  The CT images were loaded into the planning software and reviewed with physics. I verified with our physicist that this increased the distance between her heart and chest wall. We will treat her with the breath-hold technique and base her planning on the breath-hold images:   Treatment planning note: the patient will be treated with opposed tangential fields using MLCs for custom blocks. I plan to prescribe 50 Gray in 25 fractions to the left chest wall, axilla, and supraclavicular region.  This will be followed by a boost with electrons to the mastectomy scar of 10 Gy in 5 fractions. Therefore, I have designed to supervised the construction of a total of 5 medically necessary complex treatment devices; this consists of 4 fields, and 1 Accuform device   3-dimensional conformal radiotherapy will be used in order to spare her lung tissue and heart from excessive dose while delivering adequate dose to her at-risk tissues. I have requested a DVH of the patient's lungs and heart as well as  her spinal cord and esophagus.  -----------------------------------  [name], MD

## 2012-07-03 NOTE — Addendum Note (Signed)
Encounter addended by: Jaquelinne Glendening Mintz Tynetta Bachmann, RN on: 07/03/2012  5:12 PM<BR>     Documentation filed: Charges VN

## 2012-07-04 NOTE — Addendum Note (Signed)
Encounter addended by: [name], MD on: 07/04/2012 10:49 AM<BR>     Documentation filed: Notes Section

## 2012-07-06 ENCOUNTER — Ambulatory Visit
Admission: RE | Admit: 2012-07-06 | Discharge: 2012-07-06 | Disposition: A | Discharge status: Home / Self Care | Payer: Medicaid Other | Source: Ambulatory Visit | Attending: Radiation Oncology | Admitting: Radiation Oncology

## 2012-07-06 DIAGNOSIS — C50119 Malignant neoplasm of central portion of unspecified female breast: Secondary | ICD-10-CM

## 2012-07-06 NOTE — Progress Notes (Signed)
Simulation Verification Note - left chest wall, nodes  The patient was brought to the treatment unit and placed in the planned treatment position. The clinical setup was verified. Then port films were obtained and uploaded to the radiation oncology medical record software.  The treatment beams were carefully compared against the planned radiation fields. The position location and shape of the radiation fields was reviewed. They targeted volume of tissue appears to be appropriately covered by the radiation beams. Organs at risk appear to be excluded as planned.  Based on my personal review, I approved the simulation verification. The patient's treatment will proceed as planned.  -----------------------------------  [name], MD

## 2012-07-09 ENCOUNTER — Ambulatory Visit
Admission: RE | Admit: 2012-07-09 | Discharge: 2012-07-09 | Disposition: A | Discharge status: Home / Self Care | Payer: Medicaid Other | Source: Ambulatory Visit | Attending: Radiation Oncology | Admitting: Radiation Oncology

## 2012-07-09 DIAGNOSIS — C50119 Malignant neoplasm of central portion of unspecified female breast: Secondary | ICD-10-CM

## 2012-07-09 DIAGNOSIS — C50919 Malignant neoplasm of unspecified site of unspecified female breast: Secondary | ICD-10-CM

## 2012-07-09 MED ORDER — RADIAPLEXRX EX GEL
Freq: Once | CUTANEOUS | Status: AC
  Administered 2012-07-09: 11:00:00 via TOPICAL

## 2012-07-09 MED ORDER — ALRA NON-METALLIC DEODORANT (RAD-ONC)
1.0000 "application " | Freq: Once | TOPICAL | Status: AC
  Administered 2012-07-09: 1 via TOPICAL

## 2012-07-09 NOTE — Progress Notes (Addendum)
Post sim completed; charted under pt education appt. Pt denies pain, does report some brief sharp pain in left axilla last night. She has some fatigue related to chemo, no loss of appetite.

## 2012-07-09 NOTE — Addendum Note (Signed)
Encounter addended by: Glennie Hawk, RN on: 07/09/2012 10:55 AM<BR>     Documentation filed: Inpatient MAR, Orders

## 2012-07-09 NOTE — Progress Notes (Signed)
Post sim ed completed w/pt. Gave pt "Radiaiton and You" booklet w/all pertinent information marked and discussed, re: fatigue, skin irritation/care, nutrition, pain. Gave pt Radiaplex, Alra deodorant w/instructions for proper use. Pt verbalized understanding. All questions answered.

## 2012-07-09 NOTE — Progress Notes (Signed)
   Weekly Management Note:  outpatient Current Dose:  2 Gy  Projected Dose: 60 Gy   Narrative:  The patient presents for routine under treatment assessment.  CBCT/MVCT images/Port film x-rays were reviewed.  The chart was checked. No new complaints other than a low grade headache, just this AM.  Physical Findings: Blood pressure 121/88, temperature 97.5, pulse 77, weight 139.0 pounds. Skin exam deferred  Impression:  The patient is tolerating radiotherapy.  Plan:  Continue radiotherapy as planned.  ________________________________   [name], M.D.

## 2012-07-10 ENCOUNTER — Ambulatory Visit
Admission: RE | Admit: 2012-07-10 | Discharge: 2012-07-10 | Disposition: A | Discharge status: Home / Self Care | Payer: Medicaid Other | Source: Ambulatory Visit | Attending: Radiation Oncology | Admitting: Radiation Oncology

## 2012-07-11 ENCOUNTER — Ambulatory Visit
Admission: RE | Admit: 2012-07-11 | Discharge: 2012-07-11 | Disposition: A | Discharge status: Home / Self Care | Payer: Medicaid Other | Source: Ambulatory Visit | Attending: Radiation Oncology | Admitting: Radiation Oncology

## 2012-07-12 ENCOUNTER — Ambulatory Visit: Payer: Medicaid Other

## 2012-07-13 ENCOUNTER — Ambulatory Visit
Admission: RE | Admit: 2012-07-13 | Discharge: 2012-07-13 | Disposition: A | Discharge status: Home / Self Care | Payer: Medicaid Other | Source: Ambulatory Visit | Attending: Radiation Oncology | Admitting: Radiation Oncology

## 2012-07-16 ENCOUNTER — Encounter: Payer: Self-pay | Admitting: Radiation Oncology

## 2012-07-16 ENCOUNTER — Ambulatory Visit
Admission: RE | Admit: 2012-07-16 | Discharge: 2012-07-16 | Disposition: A | Discharge status: Home / Self Care | Payer: Medicaid Other | Source: Ambulatory Visit | Attending: Radiation Oncology | Admitting: Radiation Oncology

## 2012-07-16 VITALS — BP 110/72 | HR 76 | Temp 97.7°F | Resp 20 | Wt 137.6 lb

## 2012-07-16 DIAGNOSIS — C50119 Malignant neoplasm of central portion of unspecified female breast: Secondary | ICD-10-CM

## 2012-07-16 NOTE — Progress Notes (Signed)
Patient here rad tx left breast 5 completed so far, skin intact, no skin changes so far,uses radiaplex gel bid, gets headache every day she has rad tx, thinks motion of the machine, will take advil tomorrow 9:18 AM

## 2012-07-16 NOTE — Progress Notes (Signed)
   Weekly Management Note: outpatient Current Dose:  10 Gy  Projected Dose: 60 Gy   Narrative:  The patient presents for routine under treatment assessment.  CBCT/MVCT images/Port film x-rays were reviewed.  The chart was checked. Doing well. She does report headaches immediately after treatment. She did not have headaches over the weekend. She says that she is very sensitive to develop motion sickness. She thinks that closing her eyes during treatment helps. No skin irritation thus far.  Physical Findings:  weight is 137 lb 9.6 oz (62.415 kg). Her oral temperature is 97.7 F (36.5 C). Her blood pressure is 110/72 and her pulse is 76. Her respiration is 20.  no skin irritation thus far over the left chest wall or lower neck  Impression:  The patient is tolerating radiotherapy.  Plan:  Continue radiotherapy as planned.  ________________________________   [name], M.D.

## 2012-07-17 ENCOUNTER — Ambulatory Visit
Admission: RE | Admit: 2012-07-17 | Discharge: 2012-07-17 | Disposition: A | Discharge status: Home / Self Care | Payer: Medicaid Other | Source: Ambulatory Visit | Attending: Radiation Oncology | Admitting: Radiation Oncology

## 2012-07-18 ENCOUNTER — Ambulatory Visit
Admission: RE | Admit: 2012-07-18 | Discharge: 2012-07-18 | Disposition: A | Discharge status: Home / Self Care | Payer: Medicaid Other | Source: Ambulatory Visit | Attending: Radiation Oncology | Admitting: Radiation Oncology

## 2012-07-19 ENCOUNTER — Ambulatory Visit
Admission: RE | Admit: 2012-07-19 | Discharge: 2012-07-19 | Disposition: A | Discharge status: Home / Self Care | Payer: Medicaid Other | Source: Ambulatory Visit | Attending: Radiation Oncology | Admitting: Radiation Oncology

## 2012-07-20 ENCOUNTER — Ambulatory Visit
Admission: RE | Admit: 2012-07-20 | Discharge: 2012-07-20 | Disposition: A | Discharge status: Home / Self Care | Payer: Medicaid Other | Source: Ambulatory Visit | Attending: Radiation Oncology | Admitting: Radiation Oncology

## 2012-07-23 ENCOUNTER — Ambulatory Visit
Admission: RE | Admit: 2012-07-23 | Discharge: 2012-07-23 | Disposition: A | Discharge status: Home / Self Care | Payer: Medicaid Other | Source: Ambulatory Visit | Attending: Radiation Oncology | Admitting: Radiation Oncology

## 2012-07-23 ENCOUNTER — Encounter: Payer: Self-pay | Admitting: Radiation Oncology

## 2012-07-23 VITALS — BP 114/80 | HR 78 | Temp 98.1°F | Resp 20 | Wt 142.1 lb

## 2012-07-23 DIAGNOSIS — C50112 Malignant neoplasm of central portion of left female breast: Secondary | ICD-10-CM

## 2012-07-23 DIAGNOSIS — C50912 Malignant neoplasm of unspecified site of left female breast: Secondary | ICD-10-CM

## 2012-07-23 NOTE — Progress Notes (Signed)
   Weekly Management Note:  outpatient Current Dose:  18 Gy  Projected Dose: 60 Gy   Narrative:  The patient presents for routine under treatment assessment.  CBCT/MVCT images/Port film x-rays were reviewed.  The chart was checked. She feels more "hormonal" than usual - teary when seeing silly commercials with puppies or cats.  Peripheral neuropathy still bothers her.  Physical Findings:  weight is 142 lb 1.6 oz (64.456 kg). Her oral temperature is 98.1 F (36.7 C). Her blood pressure is 114/80 and her pulse is 78. Her respiration is 20.  Early erythema over Left chest Wall.  Impression:  The patient is tolerating radiotherapy.  Plan:  Continue radiotherapy as planned. Gave pt a [facility] flyer and discussed survivorship.  ________________________________   [name], M.D.

## 2012-07-23 NOTE — Progress Notes (Signed)
Pt denies pain, fatigue, loss of appetite. Applying Radiaplex lotion to tx area of left chest wall, has very slight pinkness to area.

## 2012-07-24 ENCOUNTER — Encounter (HOSPITAL_COMMUNITY): Payer: Self-pay

## 2012-07-24 ENCOUNTER — Encounter (HOSPITAL_COMMUNITY): Payer: Medicaid Other | Attending: Oncology

## 2012-07-24 ENCOUNTER — Ambulatory Visit
Admission: RE | Admit: 2012-07-24 | Discharge: 2012-07-24 | Disposition: A | Discharge status: Home / Self Care | Payer: Medicaid Other | Source: Ambulatory Visit | Attending: Radiation Oncology | Admitting: Radiation Oncology

## 2012-07-24 DIAGNOSIS — C50919 Malignant neoplasm of unspecified site of unspecified female breast: Secondary | ICD-10-CM | POA: Insufficient documentation

## 2012-07-24 DIAGNOSIS — Z95828 Presence of other vascular implants and grafts: Secondary | ICD-10-CM

## 2012-07-24 DIAGNOSIS — Z452 Encounter for adjustment and management of vascular access device: Secondary | ICD-10-CM

## 2012-07-24 HISTORY — DX: Presence of other vascular implants and grafts: Z95.828

## 2012-07-24 MED ORDER — HEPARIN SOD (PORK) LOCK FLUSH 100 UNIT/ML IV SOLN
500.0000 [IU] | Freq: Once | INTRAVENOUS | Status: AC
  Administered 2012-07-24: 500 [IU] via INTRAVENOUS
  Filled 2012-07-24: qty 5

## 2012-07-24 MED ORDER — HEPARIN SOD (PORK) LOCK FLUSH 100 UNIT/ML IV SOLN
INTRAVENOUS | Status: AC
  Filled 2012-07-24: qty 5

## 2012-07-24 MED ORDER — SODIUM CHLORIDE 0.9 % IJ SOLN
10.0000 mL | INTRAMUSCULAR | Status: DC | PRN
  Administered 2012-07-24: 10 mL via INTRAVENOUS
  Filled 2012-07-24: qty 10

## 2012-07-24 NOTE — Progress Notes (Signed)
Bethany Michael presented for Portacath access and flush. Proper placement of portacath confirmed by CXR. Portacath located  chest wall accessed with  H 20 needle. Good blood return present. Portacath flushed with 20ml NS and 500U/8ml Heparin and needle removed intact. Procedure without incident. Patient tolerated procedure well.

## 2012-07-25 ENCOUNTER — Ambulatory Visit
Admission: RE | Admit: 2012-07-25 | Discharge: 2012-07-25 | Disposition: A | Discharge status: Home / Self Care | Payer: Medicaid Other | Source: Ambulatory Visit | Attending: Radiation Oncology | Admitting: Radiation Oncology

## 2012-07-26 ENCOUNTER — Ambulatory Visit
Admission: RE | Admit: 2012-07-26 | Discharge: 2012-07-26 | Disposition: A | Discharge status: Home / Self Care | Payer: Medicaid Other | Source: Ambulatory Visit | Attending: Radiation Oncology | Admitting: Radiation Oncology

## 2012-07-27 ENCOUNTER — Ambulatory Visit
Admission: RE | Admit: 2012-07-27 | Discharge: 2012-07-27 | Disposition: A | Discharge status: Home / Self Care | Payer: Medicaid Other | Source: Ambulatory Visit | Attending: Radiation Oncology | Admitting: Radiation Oncology

## 2012-07-30 ENCOUNTER — Ambulatory Visit
Admission: RE | Admit: 2012-07-30 | Discharge: 2012-07-30 | Disposition: A | Discharge status: Home / Self Care | Payer: Medicaid Other | Source: Ambulatory Visit | Attending: Radiation Oncology | Admitting: Radiation Oncology

## 2012-07-30 ENCOUNTER — Encounter: Payer: Self-pay | Admitting: Radiation Oncology

## 2012-07-30 VITALS — BP 109/65 | HR 72 | Temp 98.3°F | Resp 20 | Wt 141.0 lb

## 2012-07-30 DIAGNOSIS — C50112 Malignant neoplasm of central portion of left female breast: Secondary | ICD-10-CM

## 2012-07-30 NOTE — Progress Notes (Signed)
   Weekly Management Note: outpatient Current Dose:  30 Gy  Projected Dose: 60 Gy   Narrative:  The patient presents for routine under treatment assessment.  CBCT/MVCT images/Port film x-rays were reviewed.  The chart was checked. Doing well. Mild skin irritation  Physical Findings:  weight is 141 lb (63.957 kg). Her oral temperature is 98.3 F (36.8 C). Her blood pressure is 109/65 and her pulse is 72. Her respiration is 20.  modest erythema over the left chest wall and low neck  Impression:  The patient is tolerating radiotherapy.  Plan:  Continue radiotherapy as planned. Continue radiaplex over the low left neck, upper left back, and left chest wall  ________________________________   [name], M.D.

## 2012-07-30 NOTE — Progress Notes (Signed)
Pt denies pain, fatigue, loss of appetite. She is applying Radiaplex to left breast.

## 2012-07-31 ENCOUNTER — Ambulatory Visit: Payer: Medicaid Other

## 2012-08-01 ENCOUNTER — Ambulatory Visit
Admission: RE | Admit: 2012-08-01 | Discharge: 2012-08-01 | Disposition: A | Discharge status: Home / Self Care | Payer: Medicaid Other | Source: Ambulatory Visit | Attending: Radiation Oncology | Admitting: Radiation Oncology

## 2012-08-02 ENCOUNTER — Ambulatory Visit
Admission: RE | Admit: 2012-08-02 | Discharge: 2012-08-02 | Disposition: A | Discharge status: Home / Self Care | Payer: Medicaid Other | Source: Ambulatory Visit | Attending: Radiation Oncology | Admitting: Radiation Oncology

## 2012-08-03 ENCOUNTER — Ambulatory Visit: Payer: Medicaid Other

## 2012-08-06 ENCOUNTER — Ambulatory Visit
Admission: RE | Admit: 2012-08-06 | Discharge: 2012-08-06 | Disposition: A | Discharge status: Home / Self Care | Payer: Medicaid Other | Source: Ambulatory Visit | Attending: Radiation Oncology | Admitting: Radiation Oncology

## 2012-08-06 ENCOUNTER — Encounter: Payer: Self-pay | Admitting: Radiation Oncology

## 2012-08-06 VITALS — BP 105/70 | HR 93 | Temp 97.8°F | Resp 20 | Wt 142.9 lb

## 2012-08-06 DIAGNOSIS — C50112 Malignant neoplasm of central portion of left female breast: Secondary | ICD-10-CM

## 2012-08-06 NOTE — Progress Notes (Signed)
   Weekly Management Note:  Outpatient Current Dose:  36 Gy  Projected Dose: 60 Gy   Narrative:  The patient presents for routine under treatment assessment.  CBCT/MVCT images/Port film x-rays were reviewed.  The chart was checked. Doing relatively well. She has some itching over her upper inner quadrant, left chest wall  Physical Findings:  weight is 142 lb 14.4 oz (64.819 kg). Her oral temperature is 97.8 F (36.6 C). Her blood pressure is 105/70 and her pulse is 93. Her respiration is 20.  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 Erythema throughout the left chest wall and over the left upper back  Impression:  The patient is tolerating radiotherapy.  Plan:  Continue radiotherapy as planned. Continue radiaplex, with 1% hydrocortisone cream as needed in the areas of itching  ________________________________   [name], M.D.

## 2012-08-06 NOTE — Progress Notes (Signed)
Pt denies pain, fatigue, loss of appetite. She is applying Radiaplex to left chest wall, left upper back for radiation dermatitis. Advised she may apply Hydrocortisone cream for itching.

## 2012-08-07 ENCOUNTER — Ambulatory Visit
Admission: RE | Admit: 2012-08-07 | Discharge: 2012-08-07 | Disposition: A | Discharge status: Home / Self Care | Payer: Medicaid Other | Source: Ambulatory Visit | Attending: Radiation Oncology | Admitting: Radiation Oncology

## 2012-08-08 ENCOUNTER — Ambulatory Visit
Admission: RE | Admit: 2012-08-08 | Discharge: 2012-08-08 | Disposition: A | Discharge status: Home / Self Care | Payer: Medicaid Other | Source: Ambulatory Visit | Attending: Radiation Oncology | Admitting: Radiation Oncology

## 2012-08-09 ENCOUNTER — Ambulatory Visit
Admission: RE | Admit: 2012-08-09 | Discharge: 2012-08-09 | Disposition: A | Discharge status: Home / Self Care | Payer: Medicaid Other | Source: Ambulatory Visit | Attending: Radiation Oncology | Admitting: Radiation Oncology

## 2012-08-10 ENCOUNTER — Ambulatory Visit
Admission: RE | Admit: 2012-08-10 | Discharge: 2012-08-10 | Disposition: A | Discharge status: Home / Self Care | Payer: Medicaid Other | Source: Ambulatory Visit | Attending: Radiation Oncology | Admitting: Radiation Oncology

## 2012-08-13 ENCOUNTER — Ambulatory Visit
Admission: RE | Admit: 2012-08-13 | Discharge: 2012-08-13 | Disposition: A | Discharge status: Home / Self Care | Payer: Medicaid Other | Source: Ambulatory Visit | Attending: Radiation Oncology | Admitting: Radiation Oncology

## 2012-08-13 VITALS — BP 116/79 | HR 69 | Temp 98.4°F | Wt 138.1 lb

## 2012-08-13 DIAGNOSIS — C50112 Malignant neoplasm of central portion of left female breast: Secondary | ICD-10-CM

## 2012-08-13 NOTE — Progress Notes (Signed)
   Weekly Management Note:  outpatient Current Dose:  46 Gy  Projected Dose: 60 Gy   Narrative:  The patient presents for routine under treatment assessment.  CBCT/MVCT images/Port film x-rays were reviewed.  The chart was checked. She is doing well.  Pain in L axilla where skin is most red.  Physical Findings:  weight is 138 lb 1.6 oz (62.642 kg). Her temperature is 98.4 F (36.9 C). Her blood pressure is 116/79 and her pulse is 69.  NAD, L chest wall skin is erythematous but intact.  Impression:  The patient is tolerating radiotherapy.  Plan:  Continue radiotherapy as planned. Given hydrogel pads  to apply to the right axilla region. Given stretch netting to assist with holding pad in place.   ________________________________   [name], M.D.

## 2012-08-13 NOTE — Progress Notes (Signed)
[name] is receiving radiation therapy to her left chest wall and [place] region. She has radiation dermatitis in the upper inner chest region and on her left shoulder.  Diffuse redness noted throughout the field with c/o burning sensation in her left axilla.  Her skin remains intact and she is applying Radiaplex gel to the treatment field at least BID. Given hydrogel pads as ordered by Dr. Basilio Cairo to apply to the right axilla region.  Given stretch netting to assist with holding pad in place.  Cautioned her to not apply any tape in this region because it could result in peeling of already irritated skin and result in more discomfort in this area.  She stated understanding.

## 2012-08-14 ENCOUNTER — Ambulatory Visit: Payer: Medicaid Other

## 2012-08-14 ENCOUNTER — Encounter: Payer: Self-pay | Admitting: Radiation Oncology

## 2012-08-15 ENCOUNTER — Ambulatory Visit
Admission: RE | Admit: 2012-08-15 | Discharge: 2012-08-15 | Disposition: A | Discharge status: Home / Self Care | Payer: Medicaid Other | Source: Ambulatory Visit | Attending: Radiation Oncology | Admitting: Radiation Oncology

## 2012-08-16 ENCOUNTER — Ambulatory Visit
Admission: RE | Admit: 2012-08-16 | Discharge: 2012-08-16 | Disposition: A | Discharge status: Home / Self Care | Payer: Medicaid Other | Source: Ambulatory Visit | Attending: Radiation Oncology | Admitting: Radiation Oncology

## 2012-08-17 ENCOUNTER — Ambulatory Visit
Admission: RE | Admit: 2012-08-17 | Discharge: 2012-08-17 | Disposition: A | Discharge status: Home / Self Care | Payer: Medicaid Other | Source: Ambulatory Visit | Attending: Radiation Oncology | Admitting: Radiation Oncology

## 2012-08-17 ENCOUNTER — Ambulatory Visit: Payer: Medicaid Other

## 2012-08-19 NOTE — Progress Notes (Signed)
rescheduled

## 2012-08-20 ENCOUNTER — Ambulatory Visit
Admission: RE | Admit: 2012-08-20 | Discharge: 2012-08-20 | Disposition: A | Discharge status: Home / Self Care | Payer: Medicaid Other | Source: Ambulatory Visit | Attending: Radiation Oncology | Admitting: Radiation Oncology

## 2012-08-20 ENCOUNTER — Ambulatory Visit (HOSPITAL_COMMUNITY): Payer: Medicaid Other | Admitting: Oncology

## 2012-08-20 ENCOUNTER — Ambulatory Visit: Payer: Medicaid Other

## 2012-08-20 ENCOUNTER — Encounter: Payer: Self-pay | Admitting: Radiation Oncology

## 2012-08-20 VITALS — BP 108/75 | HR 60 | Temp 97.8°F | Resp 20 | Wt 142.2 lb

## 2012-08-20 DIAGNOSIS — C50912 Malignant neoplasm of unspecified site of left female breast: Secondary | ICD-10-CM

## 2012-08-20 NOTE — Progress Notes (Signed)
Pt denies pain but does have tenderness in left axilla where she has moist desquamation. Advised she apply antibiotic ointment w/pain reliever. She is using Hydrogels pads in axilla w/some relief.  Applying Radiaplex to other tx area. She denies fatigue, loss of appetite.

## 2012-08-20 NOTE — Progress Notes (Signed)
   Weekly Management Note:  Out patient Current Dose:  54 Gy  Projected Dose: 60 Gy   Narrative:  The patient presents for routine under treatment assessment.  CBCT/MVCT images/Port film x-rays were reviewed.  The chart was checked. Doing well. She does have some peeling in the left axilla. Denies any itching  Physical Findings:  weight is 142 lb 3.2 oz (64.501 kg). Her oral temperature is 97.8 F (36.6 C). Her blood pressure is 108/75 and her pulse is 60. Her respiration is 20.  diffuse dry desquamation in the upper inner quadrant. Peeling, relatively dry, in left axilla  Impression:  The patient is tolerating radiotherapy.  Plan:  Continue radiotherapy as planned. Apply radiaplex throughout, with Neosporin in the peeling area of the left axilla. Card given for 1 month followup.  ________________________________   [name], M.D.

## 2012-08-21 ENCOUNTER — Ambulatory Visit
Admission: RE | Admit: 2012-08-21 | Discharge: 2012-08-21 | Disposition: A | Discharge status: Home / Self Care | Payer: Medicaid Other | Source: Ambulatory Visit | Attending: Radiation Oncology | Admitting: Radiation Oncology

## 2012-08-22 ENCOUNTER — Ambulatory Visit
Admission: RE | Admit: 2012-08-22 | Discharge: 2012-08-22 | Disposition: A | Discharge status: Home / Self Care | Payer: Medicaid Other | Source: Ambulatory Visit | Attending: Radiation Oncology | Admitting: Radiation Oncology

## 2012-08-22 ENCOUNTER — Ambulatory Visit: Payer: Medicaid Other

## 2012-08-23 ENCOUNTER — Encounter (HOSPITAL_COMMUNITY): Payer: Self-pay | Admitting: Oncology

## 2012-08-23 ENCOUNTER — Encounter: Payer: Self-pay | Admitting: Radiation Oncology

## 2012-08-23 ENCOUNTER — Encounter (HOSPITAL_COMMUNITY): Payer: Medicaid Other | Attending: Oncology | Admitting: Oncology

## 2012-08-23 ENCOUNTER — Ambulatory Visit
Admission: RE | Admit: 2012-08-23 | Discharge: 2012-08-23 | Disposition: A | Discharge status: Home / Self Care | Payer: Medicaid Other | Source: Ambulatory Visit | Attending: Radiation Oncology | Admitting: Radiation Oncology

## 2012-08-23 VITALS — BP 101/68 | HR 76 | Temp 98.0°F | Resp 16 | Wt 140.0 lb

## 2012-08-23 DIAGNOSIS — C50912 Malignant neoplasm of unspecified site of left female breast: Secondary | ICD-10-CM

## 2012-08-23 DIAGNOSIS — C50919 Malignant neoplasm of unspecified site of unspecified female breast: Secondary | ICD-10-CM | POA: Insufficient documentation

## 2012-08-23 DIAGNOSIS — Z171 Estrogen receptor negative status [ER-]: Secondary | ICD-10-CM

## 2012-08-23 DIAGNOSIS — L539 Erythematous condition, unspecified: Secondary | ICD-10-CM

## 2012-08-23 DIAGNOSIS — E079 Disorder of thyroid, unspecified: Secondary | ICD-10-CM

## 2012-08-23 NOTE — Patient Instructions (Addendum)
[facility] Discharge Instructions  RECOMMENDATIONS MADE BY THE CONSULTANT AND ANY TEST RESULTS WILL BE SENT TO YOUR REFERRING PHYSICIAN.  CBC DIFF and CMET in 6 months.  Ultrasound of thyroid to be scheduled in May 2014. Return to see MD in 6 months. See primary medical doctor once a year for general physicals. Report any issues/concerns to clinic as needed.  Thank you for choosing [facility] to provide your oncology and hematology care.  To afford each patient quality time with our providers, please arrive at least 15 minutes before your scheduled appointment time.  With your help, our goal is to use those 15 minutes to complete the necessary work-up to ensure our physicians have the information they need to help with your evaluation and healthcare recommendations.    Effective January 1st, 2014, we ask that you re-schedule your appointment with our physicians should you arrive 10 or more minutes late for your appointment.  We strive to give you quality time with our providers, and arriving late affects you and other patients whose appointments are after yours.    Again, thank you for choosing [facility].  Our hope is that these requests will decrease the amount of time that you wait before being seen by our physicians.       _____________________________________________________________  Should you have questions after your visit to [facility], please contact our office at [phone] between the hours of 8:30 a.m. and 5:00 p.m.  Voicemails left after 4:30 p.m. will not be returned until the following business day.  For prescription refill requests, have your pharmacy contact our office with your prescription refill request.

## 2012-08-23 NOTE — Progress Notes (Signed)
Bethany Asa, MD [address] [place] [address]  Cancer of left breast  CURRENT THERAPY: Observation  INTERVAL HISTORY: Bethany Michael 52 y.o. female returns for  regular  visit for followup of locally advanced breast cancer on the left approximately 4 cm in size clinically ER, PR negative Ki-67 are her 50% HER-2/neu not amplified. MRI revealed a 4.9 x 4.9 x 3.9 cm mass about seen the pectoralis fascia with skin thickening but no obvious axillary nodes. She also had atypical cells and a small nodule in the right breast in the retroareolar area .  She is SP FEC x 6 cycles (11/21/11- 01/31/12) followed by 4 cycles of Docetaxel (02/20/12- 04/02/12).  This was followed by B/L mastectomies by Dr. Abbey Chatters on 05/03/2012) with negative margins and micrometastatic carcinoma in 1 lymph node.  S/P radiation by Dr. Basilio Cairo beginning in Feb 2014 and ending 08/23/2012.   Bethany Michael is doing well.  He hair is growing back nicely although she hates the color and that it is not curly.    She does have some erythema and dry desquamation from radiation therapy.  She is going to continue with the cream provided by radiation.  She admits that it is tender.  She otherwise denies any complaints.  She asks what the future plan is.  From an oncology standpoint, she had a triple negative breast cancer.  We will need to see her every 6 months for follow-up.  Her PET scan for staging purposes in June 2013 showed a right thyroid focal hypermetabolic area.  We will pursue that with an [place] of thyroid.  She is agreeable to that only if we can wait until MAY ffor the [place].  It was then recommended that she have annual physical by Dr. Gerda Diss her PCP.  She reports that she does not want to see more physicians.  I explained to her that we will focus on the breast cancer and defer other medical issues that may arise to her PCP.  She explains that she feels good and if anything happens she will start to feel poor and follow-up with him;  but as long as she feels well, then there is nothing wrong and no need to see her PCP.  I provided her education regarding the insidious disease course of a number of diseases or syndromes including DM and HTN.  She is not agreeable to this.   I also mentioned that she is 51 and we should think about preventative medicine such as a colonoscopy and she adamently refused this.   I told Bethany Michael that one of our goals is to keep her as healthy as possible moving forward.   Oncologically, she denies any complaints and ROS questioning is negative.   Past Medical History  Diagnosis Date  . Cancer     breast  . Family history of anesthesia complication     Father "affected cognition"  . Hyperthyroidism   . Kidney stone     presently  . Cancer of left breast 10/03/11    ER/PR -, Her 2 -  . Status post chemotherapy      NEOADJUVANT:6 CYCLES FEC / 4 CYCLES TAXOTERE  . Port-a-cath in place 10/2011  . S/P bilateral mastectomy 05/03/12    right-benign, left inv ductal, DCIS  . Port catheter in place 07/24/2012    has Nausea; Hyperthyroidism; Kidney stone; Cancer of left breast; Status post chemotherapy; S/P bilateral mastectomy; and Port catheter in place on her problem list.  is allergic to latex.  [name] had no medications administered during this visit.  Past Surgical History  Procedure Laterality Date  . Cesarean section      3 previous  . Portacath placement  11/17/2011    Procedure: INSERTION PORT-A-CATH;  Surgeon: Adolph Pollack, MD;  Location: [facility];  Service: General;  Laterality: N/A;  ultrasound guided Portacath insertion right side  . Doppler echocardiography      2013  . Mastectomy with axillary lymph node dissection  05/03/2012    Procedure: MASTECTOMY WITH AXILLARY LYMPH NODE DISSECTION;  Surgeon: Adolph Pollack, MD;  Location: [facility] OR;  Service: General;  Laterality: Left;  bilateral mastectomies with left axillary sentinel lymph node dissection  .  Total mastectomy  05/03/2012    Procedure: TOTAL MASTECTOMY;  Surgeon: Adolph Pollack, MD;  Location: [facility] OR;  Service: General;  Laterality: Bilateral;  bilateral mastectomies with left axillary sentinel lymph node dissection    Denies any headaches, dizziness, double vision, fevers, chills, night sweats, nausea, vomiting, diarrhea, constipation, chest pain, heart palpitations, shortness of breath, blood in stool, black tarry stool, urinary pain, urinary burning, urinary frequency, hematuria.   PHYSICAL EXAMINATION  ECOG PERFORMANCE STATUS: 1 - Symptomatic but completely ambulatory  Filed Vitals:   08/23/12 1200  BP: 101/68  Pulse: 76  Temp: 98 F (36.7 C)  Resp: 16    GENERAL:alert, no distress, well nourished, well developed, comfortable, cooperative and smiling SKIN: skin color, texture, turgor are normal, no rashes or significant lesions HEAD: Normocephalic, No masses, lesions, tenderness or abnormalities.  Short hair growing back. EYES: normal, Conjunctiva are pink and non-injected EARS: External ears normal OROPHARYNX:mucous membranes are moist  NECK: supple, no adenopathy, thyroid normal size, non-tender, without nodularity, no stridor, non-tender, trachea midline LYMPH:  no palpable lymphadenopathy BREAST:B/L post-mastectomy sites well healed and free of suspicious changes with right sided erythema, particularly in axilla, with desquamation.  LUNGS: clear to auscultation and percussion HEART: regular rate & rhythm, no murmurs, no gallops, S1 normal and S2 normal ABDOMEN:abdomen soft, non-tender and normal bowel sounds BACK: Back symmetric, no curvature., No CVA tenderness EXTREMITIES:less then 2 second capillary refill, no joint deformities, effusion, or inflammation, no edema, no skin discoloration, no clubbing, no cyanosis  NEURO: alert & oriented x 3 with fluent speech, no focal motor/sensory deficits, gait normal   LABORATORY DATA: CBC    Component Value Date/Time    WBC 4.8 05/21/2012 1329   WBC 4.7 11/01/2011 0924   RBC 3.91 05/21/2012 1329   RBC 4.18 11/01/2011 0924   HGB 11.5* 05/21/2012 1329   HGB 13.2 11/01/2011 0924   HCT 35.9* 05/21/2012 1329   HCT 39.6 11/01/2011 0924   PLT 186 05/21/2012 1329   PLT 197 11/01/2011 0924   MCV 91.8 05/21/2012 1329   MCV 94.8 11/01/2011 0924   MCH 29.4 05/21/2012 1329   MCH 31.6 11/01/2011 0924   MCHC 32.0 05/21/2012 1329   MCHC 33.4 11/01/2011 0924   RDW 15.4 05/21/2012 1329   RDW 13.9 11/01/2011 0924   LYMPHSABS 1.6 05/21/2012 1329   LYMPHSABS 1.3 11/01/2011 0924   MONOABS 0.5 05/21/2012 1329   MONOABS 0.4 11/01/2011 0924   EOSABS 0.3 05/21/2012 1329   EOSABS 0.1 11/01/2011 0924   BASOSABS 0.1 05/21/2012 1329   BASOSABS 0.1 11/01/2011 0924      Chemistry      Component Value Date/Time   NA 138 05/21/2012 1329   K 3.6 05/21/2012 1329  CL 103 05/21/2012 1329   CO2 30 05/21/2012 1329   BUN 19 05/21/2012 1329   CREATININE 0.66 05/21/2012 1329      Component Value Date/Time   CALCIUM 9.3 05/21/2012 1329   ALKPHOS 62 05/21/2012 1329   AST 13 05/21/2012 1329   ALT 9 05/21/2012 1329   BILITOT 0.3 05/21/2012 1329      PATHOLOGY:   05/03/2012  ADDITIONAL INFORMATION: 7. CHROMOGENIC IN-SITU HYBRIDIZATION Interpretation HER-2/NEU BY CISH - NO AMPLIFICATION OF HER-2 DETECTED. THE RATIO OF HER-2: CEP 17 SIGNALS WAS 1.24. Reference range: Ratio: HER2:CEP17 < 1.8 - gene amplification not observed Ratio: HER2:CEP 17 1.8-2.2 - equivocal result Ratio: HER2:CEP17 > 2.2 - gene amplification observed Abigail Miyamoto MD Pathologist, Electronic Signature ( Signed 05/10/2012) 7. PROGNOSTIC INDICATORS - ACIS Results IMMUNOHISTOCHEMICAL AND MORPHOMETRIC ANALYSIS BY THE AUTOMATED CELLULAR IMAGING SYSTEM (ACIS) Estrogen Receptor (Negative, <1%): 0%, NEGATIVE Progesterone Receptor (Negative, <1%): 0%, NEGATIVE COMMENT: The negative hormone receptor study(ies) in this case have an internal positive control. All controls  stained appropriately 1 of 5 FINAL for MINAHIL, QUINLIVAN ([id number]) ADDITIONAL INFORMATION:(continued) Pecola Leisure MD Pathologist, Electronic Signature ( Signed 05/09/2012) FINAL DIAGNOSIS Diagnosis 1. Breast, simple mastectomy, Right - RADIAL SCAR(S) WITH USUAL DUCTAL HYPERPLASIA AND CALCIFICATIONS, THE LARGEST SPANS 1.8 CM. - FIBROCYSTIC CHANGES WITH ADENOSIS AND CALCIFICATIONS. - THERE IS NO EVIDENCE OF MALIGNANCY. 2. Lymph node, sentinel, biopsy, Left sentinel #1 - MICROMETASTATIC CARCINOMA IN 1 OF 1 LYMPH NODE (1MIC/1). 3. Lymph node, sentinel, biopsy, Left sentinel #2 - THERE IS NO EVIDENCE OF CARCINOMA IN 1 OF 1 LYMPH NODE (0/1). 4. Lymph node, sentinel, biopsy, Left sentinel #3 - THERE IS NO EVIDENCE OF CARCINOMA IN 1 OF 1 LYMPH NODE (0/1). 5. Lymph node, sentinel, biopsy, Left #4 - THERE IS NO EVIDENCE OF CARCINOMA IN 1 OF 1 LYMPH NODE (0/1). 6. Lymph node, sentinel, biopsy, Left #5 - THERE IS NO EVIDENCE OF CARCINOMA IN 1 OF 1 LYMPH NODE (0/1). 7. Breast, simple mastectomy, Left - INVASIVE DUCTAL CARCINOMA, GRADE III/III, SCATTERED FOCI WITHIN A 3.5 CM STELLATE AREA. - DUCTAL CARCINOMA IN SITU, HIGH GRADE. - FIBROCYSTIC CHANGES WITH ADENOSIS AND CALCIFICATIONS. - THE SURGICAL RESECTION MARGINS ARE NEGATIVE FOR CARCINOMA. - SEE ONCOLOGY TABLE BELOW. Microscopic Comment 7. BREAST, INVASIVE TUMOR, WITH LYMPH NODE SAMPLING Specimen, including laterality: Left breast Procedure: Simple mastectomy Grade: III Tubule formation: 3 Nuclear pleomorphism: 3 Mitotic: 2 Tumor size (gross measurement): Scattered foci of invasive carcinoma present in a grossly identified 3.5 cm stellate lesion Margins: Negative for carcinoma Invasive, distance to closest margin: 0.5 cm to deep margin (gross measurement). In-situ, distance to closest margin: 0.5 cm to deep margin (gross measurement). 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 Of note, more extensive treatment related changes are identified in the breast tissue than the lymph nodal tissue. Extent of tumor: Confined to breast parenchyma Lymph nodes: # examined: 5 Lymph nodes with metastasis: 1 Isolated tumor cells (< 0.2 mm): 0 Micrometastasis: (> 0.2 mm and < 2.0 mm): 1 Macrometastasis: (> 2.0 mm): 0 Extracapsular extension: Not identified Breast prognostic profile: Will be repeated on the current case and the results reported separately TNM: ypT2, ypN1 Comments: Grossly there is a 3.5 cm stellate area identified which on histologic evaluation contains  high grade ductal carcinoma in situ with scattered foci of Grade III invasive ductal carcinoma. The background breast parenchyma throughout the specimen reveals radial scars with calcifications as well as fibrocytic changes with adenosis and calcifications. (JBK:caf 05/07/12) Pecola Leisure MD Pathologist, Electronic Signature (Case signed 05/07/2012)    ASSESSMENT:  1.  locally advanced breast cancer on the left approximately 4 cm in size clinically ER, PR negative Ki-67 are her 50% HER-2/neu not amplified. MRI revealed a 4.9 x 4.9 x 3.9 cm mass about seen the pectoralis fascia with skin thickening but no obvious axillary nodes. She also had atypical cells and a small nodule in the right breast in the retroareolar area .  She is SP FEC x 6 cycles (11/21/11- 01/31/12) followed by 4 cycles of Docetaxel (02/20/12- 04/02/12).  This was followed by B/L mastectomies by Dr. Abbey Chatters on 05/03/2012) with negative margins and micrometastatic carcinoma in 1 lymph node.  S/P radiation by Dr. Basilio Cairo beginning in Feb 2014 and ending 08/23/2012.  2. Right thyroid focal  hypermetabolic area, will be evaluated in May. 3. Right sided chest and back erythema secondary to radiation.  PLAN:  1. I personally reviewed and went over laboratory results with the patient. 2. I personally reviewed and went over pathology results with the patient. 3. Labs in 6 months: CBC diff, CMET 4. Recommend annual follow-up with PCP 5. Patient is due for colonoscopy, but she has vehemently declined  6. [place] of thyroid due to focal hypermetabolism found in right lobe on PET in June 2013. 7. Return in 6 months for follow-up.  All questions were answered. The patient knows to call the clinic with any problems, questions or concerns. We can certainly see the patient much sooner if necessary.  Patient and plan will be discussed with Dr. Mariel Sleet within the next 24 hours.    KEFALAS,THOMAS

## 2012-08-28 NOTE — Progress Notes (Signed)
Name: Bethany Michael   MRN: [id number]  Date:  08/14/2012   DOB: 31-Mar-1961  Status:outpatient    DIAGNOSIS: Breast cancer.  CONSENT VERIFIED: yes   SET UP: Patient is setup supine   IMMOBILIZATION:  The following immobilization was used:Custom Moldable Pillow, breast board.   NARRATIVE: [name] underwent complex simulation and treatment planning for her boost treatment today.  Her tumor volume was outlined on the planning CT scan. The depth of her cavity was measured. 6  MeV electrons will be prescribed to the  98% Isodose line.   A block will be used for beam modification purposes.  A special port plan is requested.  10 Gy in 5 fractions has been prescribed to cover her chest wall scar.  -----------------------------------  [name], MD

## 2012-08-28 NOTE — Progress Notes (Signed)
  Radiation Oncology         [phone] ________________________________  Name: Bethany Michael MRN: [id number]  Date: 08/23/2012  DOB: [birth year]  End of Treatment Note  Diagnosis:  Left central breast cancer ypT2 N1 mic, triple negative, grade3. (clinically, this was T3, N0, M0)  Indication for treatment:  Curative  Radiation treatment dates:   07/09/2012-08/23/2012     Site/dose:   Treated with Breathhold Technique. 1) Left Chest Wall / 50 Gy in 25 fractions 2) Left Supraclavicular fossa/ 46 Gy in 23 fractions 3) Left Posterior Axillary boost / 2.898 Gy in 23 fractions 4) Left Chest Wall Scar boost / 10 Gy in 5 fractions  Beams/energy:   ] 1) Opposed tangents /  6 MV photons 2) Right anterior oblique / 10 MV photons 3) PA / photons 4) En face electrons / 6 MeV electrons     Narrative: The patient tolerated radiation treatment relatively well.  She developed dry peeling of some of her skin, addressed with neosporin.  Plan: The patient has completed radiation treatment. The patient will return to radiation oncology clinic for routine followup in one month. I advised them to call or return sooner if they have any questions or concerns related to their recovery or treatment.  -----------------------------------  [name], MD

## 2012-09-20 ENCOUNTER — Encounter: Payer: Self-pay | Admitting: Radiation Oncology

## 2012-09-21 ENCOUNTER — Encounter: Payer: Self-pay | Admitting: Radiation Oncology

## 2012-09-21 ENCOUNTER — Ambulatory Visit
Admission: RE | Admit: 2012-09-21 | Discharge: 2012-09-21 | Disposition: A | Discharge status: Home / Self Care | Payer: Medicaid Other | Source: Ambulatory Visit | Attending: Radiation Oncology | Admitting: Radiation Oncology

## 2012-09-21 VITALS — BP 102/64 | HR 73 | Temp 97.9°F | Ht 67.0 in | Wt 144.4 lb

## 2012-09-21 DIAGNOSIS — C50912 Malignant neoplasm of unspecified site of left female breast: Secondary | ICD-10-CM

## 2012-09-21 HISTORY — DX: Personal history of irradiation: Z92.3

## 2012-09-21 NOTE — Progress Notes (Addendum)
Ms. Kissinger here for assessment following radiation therapy to her left chest wall.  She presently denies any pain and states that she has areas that peeled and have some discoloration at the moment.  She is having hot flashes which interrupts her sleep patten, thus causing fatigue.   States she has a good appetite.     Ms. Maclaren states she has been using Latex for housecleaning and is not having any rash, nor Hives.  She requested I remove this as an allergy

## 2012-09-23 ENCOUNTER — Encounter: Payer: Self-pay | Admitting: Radiation Oncology

## 2012-09-23 NOTE — Progress Notes (Signed)
  Radiation Oncology         [phone] ________________________________  Name: Bethany Michael MRN: [id number]  Date: 09/21/2012  DOB: 08/21/1960  Follow-Up Visit Note  outpatient  Diagnosis: Left central breast cancer ypT2 N1 mic, triple negative, grade3. (clinically, this was T3, N0, M0)   Indication for treatment: Curative   Radiation treatment dates: 07/09/2012-08/23/2012  Site/dose: Treated with Breathhold Technique.  1) Left Chest Wall / 50 Gy in 25 fractions  2) Left Supraclavicular fossa/ 46 Gy in 23 fractions  3) Left Posterior Axillary boost / 2.898 Gy in 23 fractions  4) Left Chest Wall Scar boost / 10 Gy in 5 fractions  Narrative:  The patient returns today for routine follow-up.  . She presently denies any pain and states that she has areas that peeled and have some discoloration at the moment. She is having hot flashes which interrupts her sleep patten, thus causing fatigue. States she has a good appetite. Overall, feels well.                               ALLERGIES:  has no active allergies.  Meds: No current outpatient prescriptions on file.   No current facility-administered medications for this encounter.    Physical Findings: The patient is in no acute distress. Patient is alert and oriented.  height is 5\' 7"  (1.702 m) and weight is 144 lb 6.4 oz (65.499 kg). Her temperature is 97.9 F (36.6 C). Her blood pressure is 102/64 and her pulse is 73. .  Mild residual hyperpigmentation over left chest wall, lower neck.  Skin a little dry.  Lab Findings: Lab Results  Component Value Date   WBC 4.8 05/21/2012   HGB 11.5* 05/21/2012   HCT 35.9* 05/21/2012   MCV 91.8 05/21/2012   PLT 186 05/21/2012    Radiographic Findings: No results found.  Impression/Plan: Told Bethany Michael she could use VitE lotion to promote further skin healing over chest wall, neck, upper back on left. I encouraged her to continue followup with medical oncology. I will see her back on an  as-needed basis. I have encouraged her to call if she has any issues or concerns in the future. I wished her the very best.   I spent 15 minutes minutes face to face with the patient and more than 50% of that time was spent in counseling and/or coordination of care. _____________________________________   [name], MD

## 2012-09-26 ENCOUNTER — Ambulatory Visit (INDEPENDENT_AMBULATORY_CARE_PROVIDER_SITE_OTHER): Payer: Medicaid Other | Admitting: General Surgery

## 2012-09-26 ENCOUNTER — Encounter (INDEPENDENT_AMBULATORY_CARE_PROVIDER_SITE_OTHER): Payer: Self-pay | Admitting: General Surgery

## 2012-09-26 VITALS — BP 120/66 | HR 52 | Temp 97.8°F | Resp 12 | Ht 67.0 in | Wt 142.8 lb

## 2012-09-26 DIAGNOSIS — Z853 Personal history of malignant neoplasm of breast: Secondary | ICD-10-CM

## 2012-09-26 NOTE — Patient Instructions (Signed)
Will remove Port-a-cath if Dr. Mariel Sleet thinks it is okay to do so.

## 2012-09-26 NOTE — Progress Notes (Signed)
Procedure:  Bilateral mastectomies and left axillary sentinel lymph node biopsy  Date:  05/03/2012  Pathology: T2N1(mic)  Hx:  She is here for a long-term followup visit. She has completed her radiation therapy and tolerated it well. She denies any nodules in the chest wall. No lymphadenopathy. She is wondering if she can have her Port-A-Cath removed.  PE: General-She looks well and is in no acute distress.  Chest wall-incisions are clean and intact. No nodularity noted.  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 She has completed radiation.  Plan:  Will discuss Port-A-Cath removal of Dr. Neijstrom.  Return visit 3 months. 

## 2012-09-27 ENCOUNTER — Other Ambulatory Visit (INDEPENDENT_AMBULATORY_CARE_PROVIDER_SITE_OTHER): Payer: Self-pay | Admitting: General Surgery

## 2012-10-04 ENCOUNTER — Encounter (HOSPITAL_COMMUNITY): Payer: Self-pay | Admitting: Pharmacy Technician

## 2012-10-04 ENCOUNTER — Encounter (HOSPITAL_COMMUNITY): Payer: Self-pay

## 2012-10-04 ENCOUNTER — Encounter (HOSPITAL_COMMUNITY)
Admission: RE | Admit: 2012-10-04 | Discharge: 2012-10-04 | Disposition: A | Discharge status: Home / Self Care | Payer: Medicaid Other | Source: Ambulatory Visit | Attending: General Surgery | Admitting: General Surgery

## 2012-10-04 HISTORY — DX: Adverse effect of antineoplastic and immunosuppressive drugs, initial encounter: T45.1X5A

## 2012-10-04 HISTORY — DX: Drug-induced polyneuropathy: G62.0

## 2012-10-04 LAB — SURGICAL PCR SCREEN
MRSA, PCR: NEGATIVE
Staphylococcus aureus: NEGATIVE

## 2012-10-04 LAB — CBC
HCT: 37.1 % (ref 36.0–46.0)
MCHC: 35.8 g/dL (ref 30.0–36.0)
MCV: 88.3 fL (ref 78.0–100.0)
Platelets: 185 10*3/uL (ref 150–400)
RDW: 14.2 % (ref 11.5–15.5)

## 2012-10-04 MED ORDER — CEFAZOLIN SODIUM-DEXTROSE 2-3 GM-% IV SOLR
2.0000 g | INTRAVENOUS | Status: AC
  Administered 2012-10-05: 2 g via INTRAVENOUS
  Filled 2012-10-04: qty 50

## 2012-10-04 NOTE — Pre-Procedure Instructions (Signed)
HILMA STEINHILBER  10/04/2012   Your procedure is scheduled on:  Friday, Oct 05, 2012  Report to [facility] at  9:15 AM.  Call this number if you have problems the morning of surgery: [phone]   Remember:   Do not eat food or drink liquids after midnight.   Take these medicines the morning of surgery with A SIP OF WATER: none             Stop taking Aspirin, and herbal medications. Do not take any NSAIDs ie: Ibuprofen, Advil, Naproxen or any medication containing Aspirin.  Do not wear jewelry, make-up or nail polish.  Do not wear lotions, powders, or perfumes. You may wear deodorant.  Do not shave 48 hours prior to surgery.   Do not bring valuables to the hospital.  Contacts, dentures or bridgework may not be worn into surgery.  Leave suitcase in the car. After surgery it may be brought to your room.  For patients admitted to the hospital, checkout time is 11:00 AM the day of discharge.   Patients discharged the day of surgery will not be allowed to drive home.  Name and phone number of your driver:   Special Instructions: Shower using CHG 2 nights before surgery and the night before surgery.  If you shower the day of surgery use CHG.  Use special wash - you have one bottle of CHG for all showers.  You should use approximately 1/3 of the bottle for each shower.   Please read over the following fact sheets that you were given: Pain Booklet, Coughing and Deep Breathing and Surgical Site Infection Prevention

## 2012-10-05 ENCOUNTER — Ambulatory Visit (HOSPITAL_COMMUNITY)
Admission: RE | Admit: 2012-10-05 | Discharge: 2012-10-05 | Disposition: A | Discharge status: Home / Self Care | Payer: Medicaid Other | Source: Ambulatory Visit | Attending: General Surgery | Admitting: General Surgery

## 2012-10-05 ENCOUNTER — Ambulatory Visit (HOSPITAL_COMMUNITY): Payer: Medicaid Other | Admitting: Anesthesiology

## 2012-10-05 ENCOUNTER — Encounter (HOSPITAL_COMMUNITY)
Admission: RE | Disposition: A | Discharge status: Home / Self Care | Payer: Self-pay | Source: Ambulatory Visit | Attending: General Surgery

## 2012-10-05 ENCOUNTER — Encounter (HOSPITAL_COMMUNITY): Payer: Self-pay | Admitting: Anesthesiology

## 2012-10-05 ENCOUNTER — Encounter (HOSPITAL_COMMUNITY): Payer: Self-pay | Admitting: *Deleted

## 2012-10-05 DIAGNOSIS — Z452 Encounter for adjustment and management of vascular access device: Secondary | ICD-10-CM | POA: Insufficient documentation

## 2012-10-05 DIAGNOSIS — Z853 Personal history of malignant neoplasm of breast: Secondary | ICD-10-CM | POA: Insufficient documentation

## 2012-10-05 DIAGNOSIS — Z9010 Acquired absence of unspecified breast and nipple: Secondary | ICD-10-CM | POA: Insufficient documentation

## 2012-10-05 HISTORY — PX: PORT-A-CATH REMOVAL: SHX5289

## 2012-10-05 SURGERY — REMOVAL PORT-A-CATH
Anesthesia: Monitor Anesthesia Care | Wound class: Clean Contaminated

## 2012-10-05 MED ORDER — LIDOCAINE-EPINEPHRINE (PF) 1 %-1:200000 IJ SOLN
INTRAMUSCULAR | Status: DC | PRN
  Administered 2012-10-05: 12:00:00 via SUBCUTANEOUS

## 2012-10-05 MED ORDER — HYDROMORPHONE HCL PF 1 MG/ML IJ SOLN: 0.2500 mg | INTRAMUSCULAR | Status: DC | PRN

## 2012-10-05 MED ORDER — LACTATED RINGERS IV SOLN
INTRAVENOUS | Status: DC | PRN
  Administered 2012-10-05: 11:00:00 via INTRAVENOUS

## 2012-10-05 MED ORDER — ONDANSETRON HCL 4 MG/2ML IJ SOLN: 4.0000 mg | Freq: Once | INTRAMUSCULAR | Status: DC | PRN

## 2012-10-05 MED ORDER — FENTANYL CITRATE 0.05 MG/ML IJ SOLN
INTRAMUSCULAR | Status: DC | PRN
  Administered 2012-10-05 (×3): 50 ug via INTRAVENOUS

## 2012-10-05 MED ORDER — BUPIVACAINE HCL (PF) 0.25 % IJ SOLN
INTRAMUSCULAR | Status: AC
  Filled 2012-10-05: qty 30

## 2012-10-05 MED ORDER — MIDAZOLAM HCL 5 MG/5ML IJ SOLN
INTRAMUSCULAR | Status: DC | PRN
  Administered 2012-10-05: 1 mg via INTRAVENOUS

## 2012-10-05 MED ORDER — PROPOFOL 10 MG/ML IV BOLUS
INTRAVENOUS | Status: DC | PRN
  Administered 2012-10-05 (×5): 20 mg via INTRAVENOUS

## 2012-10-05 MED ORDER — ONDANSETRON HCL 4 MG/2ML IJ SOLN
INTRAMUSCULAR | Status: DC | PRN
  Administered 2012-10-05: 4 mg via INTRAVENOUS

## 2012-10-05 MED ORDER — SCOPOLAMINE 1 MG/3DAYS TD PT72
MEDICATED_PATCH | TRANSDERMAL | Status: AC
  Administered 2012-10-05: 1.5 mg
  Filled 2012-10-05: qty 1

## 2012-10-05 MED ORDER — LIDOCAINE-EPINEPHRINE (PF) 1 %-1:200000 IJ SOLN
INTRAMUSCULAR | Status: AC
  Filled 2012-10-05: qty 10

## 2012-10-05 MED ORDER — LACTATED RINGERS IV SOLN
INTRAVENOUS | Status: DC
  Administered 2012-10-05: 11:00:00 via INTRAVENOUS

## 2012-10-05 SURGICAL SUPPLY — 38 items
BENZOIN TINCTURE PRP APPL 2/3 (GAUZE/BANDAGES/DRESSINGS) ×2 IMPLANT
BLADE SURG 15 STRL LF DISP TIS (BLADE) ×1 IMPLANT
BLADE SURG 15 STRL SS (BLADE) ×1
CHLORAPREP W/TINT 10.5 ML (MISCELLANEOUS) ×2 IMPLANT
CLOTH BEACON ORANGE TIMEOUT ST (SAFETY) ×2 IMPLANT
COVER SURGICAL LIGHT HANDLE (MISCELLANEOUS) ×2 IMPLANT
DECANTER SPIKE VIAL GLASS SM (MISCELLANEOUS) ×4 IMPLANT
DRAPE PED LAPAROTOMY (DRAPES) ×2 IMPLANT
DRAPE UTILITY 15X26 W/TAPE STR (DRAPE) ×4 IMPLANT
DRSG TEGADERM 4X4.75 (GAUZE/BANDAGES/DRESSINGS) ×2 IMPLANT
ELECT CAUTERY BLADE 6.4 (BLADE) ×2 IMPLANT
ELECT REM PT RETURN 9FT ADLT (ELECTROSURGICAL) ×2
ELECTRODE REM PT RTRN 9FT ADLT (ELECTROSURGICAL) ×1 IMPLANT
GAUZE SPONGE 2X2 8PLY STRL LF (GAUZE/BANDAGES/DRESSINGS) ×1 IMPLANT
GAUZE SPONGE 4X4 16PLY XRAY LF (GAUZE/BANDAGES/DRESSINGS) ×2 IMPLANT
GLOVE BIOGEL PI IND STRL 7.0 (GLOVE) ×1 IMPLANT
GLOVE BIOGEL PI IND STRL 8 (GLOVE) ×1 IMPLANT
GLOVE BIOGEL PI INDICATOR 7.0 (GLOVE) ×1
GLOVE BIOGEL PI INDICATOR 8 (GLOVE) ×1
GLOVE ECLIPSE 8.0 STRL XLNG CF (GLOVE) ×2 IMPLANT
GLOVE SURG SS PI 7.0 STRL IVOR (GLOVE) ×2 IMPLANT
GOWN STRL NON-REIN LRG LVL3 (GOWN DISPOSABLE) ×4 IMPLANT
KIT BASIN OR (CUSTOM PROCEDURE TRAY) ×2 IMPLANT
KIT ROOM TURNOVER OR (KITS) ×2 IMPLANT
NEEDLE HYPO 25GX1X1/2 BEV (NEEDLE) ×2 IMPLANT
NS IRRIG 1000ML POUR BTL (IV SOLUTION) ×2 IMPLANT
PACK SURGICAL SETUP 50X90 (CUSTOM PROCEDURE TRAY) ×2 IMPLANT
PAD ARMBOARD 7.5X6 YLW CONV (MISCELLANEOUS) ×4 IMPLANT
PENCIL BUTTON HOLSTER BLD 10FT (ELECTRODE) ×2 IMPLANT
SPONGE GAUZE 2X2 STER 10/PKG (GAUZE/BANDAGES/DRESSINGS) ×1
STRIP CLOSURE SKIN 1/2X4 (GAUZE/BANDAGES/DRESSINGS) ×2 IMPLANT
SUT MON AB 4-0 PC3 18 (SUTURE) ×2 IMPLANT
SUT VIC AB 3-0 SH 27 (SUTURE) ×1
SUT VIC AB 3-0 SH 27X BRD (SUTURE) ×1 IMPLANT
SYR CONTROL 10ML LL (SYRINGE) ×2 IMPLANT
TOWEL OR 17X24 6PK STRL BLUE (TOWEL DISPOSABLE) ×2 IMPLANT
TOWEL OR 17X26 10 PK STRL BLUE (TOWEL DISPOSABLE) ×2 IMPLANT
WATER STERILE IRR 1000ML POUR (IV SOLUTION) IMPLANT

## 2012-10-05 NOTE — H&P (View-Only) (Signed)
Procedure:  Bilateral mastectomies and left axillary sentinel lymph node biopsy  Date:  05/03/2012  Pathology: T2N1(mic)  Hx:  She is here for a long-term followup visit. She has completed her radiation therapy and tolerated it well. She denies any nodules in the chest wall. No lymphadenopathy. She is wondering if she can have her Port-A-Cath removed.  PE: Bethany Michael looks well and is in no acute distress.  Chest wall-incisions are clean and intact. No nodularity noted.  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 She has completed radiation.  Plan:  Will discuss Port-A-Cath removal of Dr. Mariel Sleet.  Return visit 3 months.

## 2012-10-05 NOTE — Anesthesia Preprocedure Evaluation (Signed)
Anesthesia Evaluation  Patient identified by MRN, date of birth, ID band Patient awake    Reviewed: Allergy & Precautions, H&P , NPO status , Patient's Chart, lab work & pertinent test results  Airway       Dental   Pulmonary          Cardiovascular     Neuro/Psych    GI/Hepatic   Endo/Other  Hyperthyroidism   Renal/GU Renal disease     Musculoskeletal   Abdominal   Peds  Hematology   Anesthesia Other Findings   Reproductive/Obstetrics                           Anesthesia Physical Anesthesia Plan  ASA: II  Anesthesia Plan: MAC   Post-op Pain Management:    Induction: Intravenous  Airway Management Planned: Mask  Additional Equipment:   Intra-op Plan:   Post-operative Plan:   Informed Consent: I have reviewed the patients History and Physical, chart, labs and discussed the procedure including the risks, benefits and alternatives for the proposed anesthesia with the patient or authorized representative who has indicated his/her understanding and acceptance.     Plan Discussed with: CRNA, Anesthesiologist and Surgeon  Anesthesia Plan Comments:         Anesthesia Quick Evaluation

## 2012-10-05 NOTE — Interval H&P Note (Signed)
History and Physical Interval Note:  10/05/2012 11:04 AM  Bethany Michael  has presented today for surgery, with the diagnosis of Breast Cancer; indwelling Port-a-Cath  The various methods of treatment have been discussed with the patient and family. After consideration of risks, benefits and other options for treatment, the patient has consented to  Procedure(s): REMOVAL PORT-A-CATH (N/A) as a surgical intervention .  The patient's history has been reviewed, patient examined, no change in status, stable for surgery.  I have reviewed the patient's chart and labs.  Questions were answered to the patient's satisfaction.     Keon Pender Shela Commons

## 2012-10-05 NOTE — Op Note (Signed)
PREOPERATIVE DIAGNOSIS:  Indwelling Port-a-cath.  POSTOPERATIVE DIAGNOSIS:  Same  PROCEDURE:    Porta-cath removal  SURGEON:  Avel Peace, M.D.  ANESTHESIA:  Local with MAC  INDICATION:  This is a 52 year old female/female with breast cancer who has received chemotherapy via the Porta-cath.  The Porta-cath is no longer needed.  He/she now presents for removal.  The procedure, risks, and aftercare have been explained preoperatively.   The right upper chest wall and neck were sterilely prepped and draped. Local anesthetic was infiltrated at the site of the port in the chest wall superficially and deep. The previous scar was incised sharply and then using electrocautery the subcutaneous tissue was divided until the port and catheter were identified.  The fibrous sheath was dissected free from the catheter and it was pulled out of the internal jugular/subclavian vein. Direct pressure was held over the vein site for 10-15 minutes.  During this part of the procedure her left hand came close to the sterile operative field, but was restrained before it could contaminate the field.  The port was then dissected free from the chest wall using electrocautery. The port and catheter were removed intact. The chest wall area was inspected and bleeding controlled with electrocautery. Once hemostasis was adequate, the chest wall incision was closed in 2 layers. The subcutaneous tissues approximated with running 3-0 Vicryl suture. The skin was closed with a running 4-0 Monocryl subcuticular stitch. Steri-Strips and a sterile dressing were applied.  She tolerated the procedure well without any apparent complications and was taken to the recovery room in satisfactory condition.

## 2012-10-05 NOTE — Transfer of Care (Signed)
Immediate Anesthesia Transfer of Care Note  Patient: Bethany Michael  Procedure(s) Performed: Procedure(s): REMOVAL PORT-A-CATH (N/A)  Patient Location: PACU  Anesthesia Type:MAC  Level of Consciousness: awake, alert , oriented and patient cooperative  Airway & Oxygen Therapy: Patient Spontanous Breathing  Post-op Assessment: Report given to PACU RN, Post -op Vital signs reviewed and stable and Patient moving all extremities  Post vital signs: Reviewed and stable  Complications: No apparent anesthesia complications

## 2012-10-05 NOTE — Anesthesia Postprocedure Evaluation (Signed)
Anesthesia Post Note  Patient: Bethany Michael  Procedure(s) Performed: Procedure(s) (LRB): REMOVAL PORT-A-CATH (N/A)  Anesthesia type: General  Patient location: PACU  Post pain: Pain level controlled  Post assessment: Patient's Cardiovascular Status Stable  Last Vitals:  Filed Vitals:   10/05/12 1215  BP: 103/64  Pulse: 64  Temp:   Resp: 16    Post vital signs: Reviewed and stable  Level of consciousness: alert  Complications: No apparent anesthesia complications

## 2012-10-08 ENCOUNTER — Telehealth (INDEPENDENT_AMBULATORY_CARE_PROVIDER_SITE_OTHER): Payer: Self-pay | Admitting: General Surgery

## 2012-10-08 ENCOUNTER — Encounter (HOSPITAL_COMMUNITY): Payer: Self-pay | Admitting: General Surgery

## 2012-10-08 NOTE — Telephone Encounter (Signed)
Left message for patient to call for po f/u appt

## 2012-10-09 ENCOUNTER — Ambulatory Visit (HOSPITAL_COMMUNITY)
Admission: RE | Admit: 2012-10-09 | Discharge: 2012-10-09 | Disposition: A | Discharge status: Home / Self Care | Payer: Medicaid Other | Source: Ambulatory Visit | Attending: Oncology | Admitting: Oncology

## 2012-10-09 DIAGNOSIS — R948 Abnormal results of function studies of other organs and systems: Secondary | ICD-10-CM | POA: Insufficient documentation

## 2012-10-09 DIAGNOSIS — Z853 Personal history of malignant neoplasm of breast: Secondary | ICD-10-CM | POA: Insufficient documentation

## 2012-10-09 DIAGNOSIS — E041 Nontoxic single thyroid nodule: Secondary | ICD-10-CM | POA: Insufficient documentation

## 2012-10-09 DIAGNOSIS — C50912 Malignant neoplasm of unspecified site of left female breast: Secondary | ICD-10-CM

## 2012-10-10 ENCOUNTER — Other Ambulatory Visit (HOSPITAL_COMMUNITY): Payer: Self-pay | Admitting: Oncology

## 2012-10-16 ENCOUNTER — Encounter (INDEPENDENT_AMBULATORY_CARE_PROVIDER_SITE_OTHER): Payer: Medicaid Other | Admitting: General Surgery

## 2012-10-31 ENCOUNTER — Encounter (INDEPENDENT_AMBULATORY_CARE_PROVIDER_SITE_OTHER): Payer: Self-pay | Admitting: General Surgery

## 2012-11-09 ENCOUNTER — Other Ambulatory Visit (INDEPENDENT_AMBULATORY_CARE_PROVIDER_SITE_OTHER): Payer: Self-pay | Admitting: General Surgery

## 2012-11-09 ENCOUNTER — Ambulatory Visit (INDEPENDENT_AMBULATORY_CARE_PROVIDER_SITE_OTHER): Payer: Medicaid Other | Admitting: General Surgery

## 2012-11-09 ENCOUNTER — Encounter (INDEPENDENT_AMBULATORY_CARE_PROVIDER_SITE_OTHER): Payer: Self-pay | Admitting: General Surgery

## 2012-11-09 VITALS — BP 114/80 | HR 66 | Temp 100.0°F | Resp 16 | Ht 67.0 in | Wt 149.2 lb

## 2012-11-09 DIAGNOSIS — E041 Nontoxic single thyroid nodule: Secondary | ICD-10-CM

## 2012-11-09 NOTE — Progress Notes (Signed)
Patient ID: Bethany Michael, female   DOB: [birth year], 52 y.o.   MRN: [id number]  Chief Complaint  Patient presents with  . Follow-up    eval ultrasound results for thyroid    HPI Bethany Michael is a 52 y.o. female.   HPI  She is here today for further evaluation and treatment of a hypermetabolic nodule in the right lobe of her thyroid gland.  Ultrasound demonstrates this to be one 2.2 cm x 0.9 cm in the mid right thyroid. She is asymptomatic from it. No hoarseness. No dysphagia. No exposure to ionizing radiation in the area. No family history of thyroid cancer.  Past Medical History  Diagnosis Date  . Cancer     breast  . Family history of anesthesia complication     Father "affected cognition"  . Hyperthyroidism   . Kidney stone     presently  . Cancer of left breast 10/03/11    ER/PR -, Her 2 -  . Status post chemotherapy      NEOADJUVANT:6 CYCLES FEC / 4 CYCLES TAXOTERE  . Port-a-cath in place 10/2011  . S/P bilateral mastectomy 05/03/12    right-benign, left inv ductal, DCIS  . Port catheter in place 07/24/2012  . S/P radiation therapy 2/1/0/14 - 08/23/12    Left Chest Wall / 50 Gy / 25 Fractions; Left supraclavicular  46 Gy / 23 Fractions; Left posterior Axillary Boost / 2.898 Gy / 23 fractions; Left Chest Wall Scar Boost / 10 Gy / 5 Fractions  . Neuropathy due to chemotherapeutic drug     Hx: of    Past Surgical History  Procedure Laterality Date  . Cesarean section      3 previous  . Portacath placement  11/17/2011    Procedure: INSERTION PORT-A-CATH;  Surgeon: Adolph Pollack, MD;  Location: [facility];  Service: General;  Laterality: N/A;  ultrasound guided Portacath insertion right side  . Doppler echocardiography      2013  . Mastectomy with axillary lymph node dissection  05/03/2012    Procedure: MASTECTOMY WITH AXILLARY LYMPH NODE DISSECTION;  Surgeon: Adolph Pollack, MD;  Location: [facility] OR;  Service: General;  Laterality: Left;  bilateral  mastectomies with left axillary sentinel lymph node dissection  . Total mastectomy  05/03/2012    Procedure: TOTAL MASTECTOMY;  Surgeon: Adolph Pollack, MD;  Location: [facility] OR;  Service: General;  Laterality: Bilateral;  bilateral mastectomies with left axillary sentinel lymph node dissection  . Port-a-cath removal N/A 10/05/2012    Procedure: REMOVAL PORT-A-CATH;  Surgeon: Adolph Pollack, MD;  Location: [facility] OR;  Service: General;  Laterality: N/A;    Family History  Problem Relation Age of Onset  . Diabetes Mother   . Diabetes Maternal Grandmother     Social History History  Substance Use Topics  . Smoking status: Never Smoker   . Smokeless tobacco: Never Used  . Alcohol Use: Yes     Comment: occassionally    No Known Allergies  No current outpatient prescriptions on file.   No current facility-administered medications for this visit.    Review of Systems Review of Systems  Endocrine:       No hyperthyroid symptoms.    Blood pressure 114/80, pulse 66, temperature 100 F (37.8 C), temperature source Temporal, resp. rate 16, height 5\' 7"  (1.702 m), weight 149 lb 3.2 oz (67.677 kg).  Physical Exam Physical Exam  Constitutional: She appears well-developed and well-nourished. No distress.  Neck:  Normal range of motion. Neck supple. No thyromegaly present.  No palpable thyroid nodule.  Pulmonary/Chest:  Port-A-Cath site scar is clean and intact.  Lymphadenopathy:    She has no cervical adenopathy.    Data Reviewed PET scan. Ultrasound report.  Assessment    Right thyroid nodule that is hypermetabolic is concerning for potential malignancy. We discussed is the     Plan    Check TSH level. Refer to interventional radiology for ultrasound guided fine needle aspiration of the nodule. If the pathology results demonstrates atypical cells, precancerous cells, or cancer we will need to discuss surgery with her. If they cells are benign, then this can be monitored. We'll  discuss the results with her when we get them        Sailor Haughn J 11/09/2012, 5:26 PM

## 2012-11-09 NOTE — Patient Instructions (Signed)
We will call you with the biopsy results.

## 2012-11-12 ENCOUNTER — Telehealth (INDEPENDENT_AMBULATORY_CARE_PROVIDER_SITE_OTHER): Payer: Self-pay | Admitting: General Surgery

## 2012-11-12 NOTE — Telephone Encounter (Signed)
Called to set up patient's thyroid biopsy. Per automated message to fax referral and [facility] will call patient with appt. Order faxed to [phone].

## 2012-11-22 ENCOUNTER — Telehealth (INDEPENDENT_AMBULATORY_CARE_PROVIDER_SITE_OTHER): Payer: Self-pay | Admitting: General Surgery

## 2012-11-22 NOTE — Telephone Encounter (Signed)
Message copied by Liliana Cline on Thu Nov 22, 2012 11:03 AM ------      Message from: Jari Sportsman      Created: Tue Nov 20, 2012  1:13 PM       Left message again this morning.            Tams      ----- Message -----         From: Liliana Cline, CMA         Sent: 11/20/2012  11:55 AM           To: Jari Sportsman, EMT            Still no call back from this patient?             Hilery Wintle       ------

## 2012-11-22 NOTE — Telephone Encounter (Signed)
Noted.  Please call and remind her.

## 2012-11-22 NOTE — Telephone Encounter (Signed)
Patient has been contacted multiple times by [facility] to set up thyroid biopsy and she has not returned their call.

## 2012-11-27 HISTORY — PX: BIOPSY THYROID: PRO38

## 2012-12-06 ENCOUNTER — Telehealth (INDEPENDENT_AMBULATORY_CARE_PROVIDER_SITE_OTHER): Payer: Self-pay

## 2012-12-06 NOTE — Telephone Encounter (Signed)
Pt calling to give Bethany Michael a good# for her to be reached with the bx appt. The pt stated her cell phone # is no good. The pt is staying with her ex husband now so you can call [phone] leave a message.

## 2012-12-07 NOTE — Telephone Encounter (Signed)
LMOV on new number pt gave [place].  She needs to contact Tina in IR at [facility] to schedule her thyroid biopsy.  Phone number was given.

## 2012-12-10 NOTE — Telephone Encounter (Signed)
Spoke with Bethany Michael in IR at [facility] to f/u on pt's thyroid bx.  Pt called IR last Friday and they are sending her order to the anesthesiologist for approval.  They will contact her to schedule the biopsy.

## 2012-12-13 LAB — TSH: TSH: 1.253 u[IU]/mL (ref 0.350–4.500)

## 2012-12-25 ENCOUNTER — Ambulatory Visit
Admission: RE | Admit: 2012-12-25 | Discharge: 2012-12-25 | Disposition: A | Discharge status: Home / Self Care | Payer: Medicaid Other | Source: Ambulatory Visit | Attending: General Surgery | Admitting: General Surgery

## 2012-12-25 ENCOUNTER — Other Ambulatory Visit (HOSPITAL_COMMUNITY)
Admission: RE | Admit: 2012-12-25 | Discharge: 2012-12-25 | Disposition: A | Discharge status: Home / Self Care | Payer: Medicaid Other | Source: Ambulatory Visit | Attending: Interventional Radiology | Admitting: Interventional Radiology

## 2012-12-25 DIAGNOSIS — E041 Nontoxic single thyroid nodule: Secondary | ICD-10-CM

## 2013-02-19 ENCOUNTER — Other Ambulatory Visit (HOSPITAL_COMMUNITY): Payer: Medicaid Other

## 2013-02-21 ENCOUNTER — Encounter (HOSPITAL_COMMUNITY): Payer: Medicaid Other | Attending: Hematology and Oncology

## 2013-02-21 ENCOUNTER — Encounter (HOSPITAL_COMMUNITY): Payer: Self-pay

## 2013-02-21 VITALS — BP 91/61 | HR 80 | Temp 97.8°F | Resp 16 | Wt 151.0 lb

## 2013-02-21 DIAGNOSIS — Z09 Encounter for follow-up examination after completed treatment for conditions other than malignant neoplasm: Secondary | ICD-10-CM | POA: Insufficient documentation

## 2013-02-21 DIAGNOSIS — Z853 Personal history of malignant neoplasm of breast: Secondary | ICD-10-CM | POA: Insufficient documentation

## 2013-02-21 DIAGNOSIS — C50919 Malignant neoplasm of unspecified site of unspecified female breast: Secondary | ICD-10-CM

## 2013-02-21 DIAGNOSIS — E041 Nontoxic single thyroid nodule: Secondary | ICD-10-CM

## 2013-02-21 DIAGNOSIS — C50912 Malignant neoplasm of unspecified site of left female breast: Secondary | ICD-10-CM

## 2013-02-21 LAB — CBC WITH DIFFERENTIAL/PLATELET
Basophils Absolute: 0 10*3/uL (ref 0.0–0.1)
Eosinophils Absolute: 0.2 10*3/uL (ref 0.0–0.7)
Eosinophils Relative: 6 % — ABNORMAL HIGH (ref 0–5)
Hemoglobin: 14.4 g/dL (ref 12.0–15.0)
Lymphocytes Relative: 33 % (ref 12–46)
Lymphs Abs: 1.2 10*3/uL (ref 0.7–4.0)
MCH: 30.8 pg (ref 26.0–34.0)
Monocytes Relative: 7 % (ref 3–12)
Neutro Abs: 1.9 10*3/uL (ref 1.7–7.7)
Neutrophils Relative %: 53 % (ref 43–77)
RBC: 4.68 MIL/uL (ref 3.87–5.11)
WBC: 3.6 10*3/uL — ABNORMAL LOW (ref 4.0–10.5)

## 2013-02-21 LAB — COMPREHENSIVE METABOLIC PANEL
ALT: 7 U/L (ref 0–35)
AST: 13 U/L (ref 0–37)
Albumin: 4 g/dL (ref 3.5–5.2)
Alkaline Phosphatase: 88 U/L (ref 39–117)
BUN: 10 mg/dL (ref 6–23)
CO2: 30 mEq/L (ref 19–32)
Calcium: 10.2 mg/dL (ref 8.4–10.5)
Chloride: 100 mEq/L (ref 96–112)
GFR calc Af Amer: >90 mL/min (ref >90)
Glucose, Bld: 86 mg/dL (ref 70–99)
Potassium: 4.1 mEq/L (ref 3.5–5.1)
Sodium: 138 mEq/L (ref 135–145)
Total Bilirubin: 0.4 mg/dL (ref 0.3–1.2)

## 2013-02-21 NOTE — Progress Notes (Signed)
[facility] OFFICE PROGRESS NOTE  Harlow Asa, MD [address] [place] [address]  DIAGNOSIS: Cancer of left breast - Plan: Cancer antigen 27.29, CEA, Vitamin D 25 hydroxy, Consult to plastic surgery, CBC with Differential, Comprehensive metabolic panel, CEA, Cancer antigen 27.29, Vitamin D 25 hydroxy, Cancer antigen 27.29, CEA, Vitamin D 25 hydroxy, CBC with Differential, Comprehensive metabolic panel  Right thyroid nodule-hypermetabolic  Chief Complaint  Patient presents with  . Breast Cancer  . Thyroid Nodule    CURRENT THERAPY: Close observation  INTERVAL HISTORY: Bethany Michael 52 y.o. female returns for followup.Bethany Michael 52 y.o. female returns for regular visit for followup of locally advanced breast cancer on the left approximately 4 cm in size clinically ER, PR negative Ki-67 are her 50% HER-2/neu not amplified. MRI revealed a 4.9 x 4.9 x 3.9 cm mass about seen the pectoralis fascia with skin thickening but no obvious axillary nodes. She also had atypical cells and a small nodule in the right breast in the retroareolar area . She is SP FEC x 6 cycles (11/21/11- 01/31/12) followed by 4 cycles of Docetaxel (02/20/12- 04/02/12). This was followed by B/L mastectomies by Dr. Abbey Chatters on 05/03/2012) with negative margins and micrometastatic carcinoma in 1 lymph node. S/P radiation by Dr. Basilio Cairo beginning in Feb 2014 and ending 08/23/2012. She continues to do well with no lymphedema. She denies any significant hot flashes, but does have peripheral neuropathy involving both lower extremities more than the upper spur she sometimes has trouble feeling the floor. She denies any cough, wheezing, sore throat, nasal drip, abdominal pain, diarrhea, constipation, L. and a, hematochezia, hematuria, vaginal bleeding or discharge, skin rash, headache, or seizures.     MEDICAL HISTORY: Past Medical History  Diagnosis Date  . Cancer     breast  . Family history of  anesthesia complication     Father "affected cognition"  . Hyperthyroidism   . Kidney stone     presently  . Cancer of left breast 10/03/11    ER/PR -, Her 2 -  . Status post chemotherapy      NEOADJUVANT:6 CYCLES FEC / 4 CYCLES TAXOTERE  . Port-a-cath in place 10/2011  . S/P bilateral mastectomy 05/03/12    right-benign, left inv ductal, DCIS  . Port catheter in place 07/24/2012  . S/P radiation therapy 2/1/0/14 - 08/23/12    Left Chest Wall / 50 Gy / 25 Fractions; Left supraclavicular  46 Gy / 23 Fractions; Left posterior Axillary Boost / 2.898 Gy / 23 fractions; Left Chest Wall Scar Boost / 10 Gy / 5 Fractions  . Neuropathy due to chemotherapeutic drug     Hx: of    INTERIM HISTORY: has Nausea; Right thyroid nodule-hypermetabolic; Kidney stone; Cancer of left breast; Status post chemotherapy; S/P bilateral mastectomy; and Port catheter in place on her problem list.    ALLERGIES:  has No Known Allergies.  MEDICATIONS: currently has no medications in their medication list.  SURGICAL HISTORY:  Past Surgical History  Procedure Laterality Date  . Cesarean section      3 previous  . Portacath placement  11/17/2011    Procedure: INSERTION PORT-A-CATH;  Surgeon: Adolph Pollack, MD;  Location: [facility];  Service: General;  Laterality: N/A;  ultrasound guided Portacath insertion right side  . Doppler echocardiography      2013  . Mastectomy with axillary lymph node dissection  05/03/2012    Procedure: MASTECTOMY WITH AXILLARY LYMPH NODE  DISSECTION;  Surgeon: Adolph Pollack, MD;  Location: [facility] OR;  Service: General;  Laterality: Left;  bilateral mastectomies with left axillary sentinel lymph node dissection  . Total mastectomy  05/03/2012    Procedure: TOTAL MASTECTOMY;  Surgeon: Adolph Pollack, MD;  Location: [facility] OR;  Service: General;  Laterality: Bilateral;  bilateral mastectomies with left axillary sentinel lymph node dissection  . Port-a-cath removal N/A 10/05/2012     Procedure: REMOVAL PORT-A-CATH;  Surgeon: Adolph Pollack, MD;  Location: [facility] OR;  Service: General;  Laterality: N/A;  . Biopsy thyroid  11/2012    FAMILY HISTORY: family history includes Diabetes in her maternal grandmother and mother.  SOCIAL HISTORY:  reports that she has never smoked. She has never used smokeless tobacco. She reports that  drinks alcohol. She reports that she does not use illicit drugs.  REVIEW OF SYSTEMS:  Other than that discussed above is noncontributory.  PHYSICAL EXAMINATION: ECOG PERFORMANCE STATUS: 1 - Symptomatic but completely ambulatory  Blood pressure 91/61, pulse 80, temperature 97.8 F (36.6 C), temperature source Oral, resp. rate 16, weight 151 lb (68.493 kg).  GENERAL:alert, no distress and comfortable SKIN: skin color, texture, turgor are normal, no rashes or significant lesions EYES: normal, Conjunctiva are pink and non-injected, sclera clear OROPHARYNX:no exudate, no erythema and lips, buccal mucosa, and tongue normal  NECK: supple, thyroid normal size, non-tender, without nodularity CHEST: Status post bilateral mastectomy with no simultaneous nodules. Minimal hyperpigmentation change of the left anterior chest. LYMPH:  no palpable lymphadenopathy in the cervical, axillary or inguinal LUNGS: clear to auscultation and percussion with normal breathing effort HEART: regular rate & rhythm and no murmurs and no lower extremity edema ABDOMEN:abdomen soft, non-tender and normal bowel sounds Musculoskeletal:no cyanosis of digits and no clubbing. No lymphedema.  NEURO: alert & oriented x 3 with fluent speech, no focal motor/sensory deficits   LABORATORY DATA: Office Visit on 02/21/2013  Component Date Value Range Status  . WBC 02/21/2013 3.6* 4.0 - 10.5 K/uL Final  . RBC 02/21/2013 4.68  3.87 - 5.11 MIL/uL Final  . Hemoglobin 02/21/2013 14.4  12.0 - 15.0 g/dL Final  . HCT 14/78/2956 43.1  36.0 - 46.0 % Final  . MCV 02/21/2013 92.1  78.0 - 100.0 fL  Final  . MCH 02/21/2013 30.8  26.0 - 34.0 pg Final  . MCHC 02/21/2013 33.4  30.0 - 36.0 g/dL Final  . RDW 21/30/8657 13.2  11.5 - 15.5 % Final  . Platelets 02/21/2013 220  150 - 400 K/uL Final  . Neutrophils Relative % 02/21/2013 53  43 - 77 % Final  . Neutro Abs 02/21/2013 1.9  1.7 - 7.7 K/uL Final  . Lymphocytes Relative 02/21/2013 33  12 - 46 % Final  . Lymphs Abs 02/21/2013 1.2  0.7 - 4.0 K/uL Final  . Monocytes Relative 02/21/2013 7  3 - 12 % Final  . Monocytes Absolute 02/21/2013 0.3  0.1 - 1.0 K/uL Final  . Eosinophils Relative 02/21/2013 6* 0 - 5 % Final  . Eosinophils Absolute 02/21/2013 0.2  0.0 - 0.7 K/uL Final  . Basophils Relative 02/21/2013 1  0 - 1 % Final  . Basophils Absolute 02/21/2013 0.0  0.0 - 0.1 K/uL Final  . Sodium 02/21/2013 138  135 - 145 mEq/L Final  . Potassium 02/21/2013 4.1  3.5 - 5.1 mEq/L Final  . Chloride 02/21/2013 100  96 - 112 mEq/L Final  . CO2 02/21/2013 30  19 - 32 mEq/L Final  . Glucose,  Bld 02/21/2013 86  70 - 99 mg/dL Final  . BUN 16/02/9603 10  6 - 23 mg/dL Final  . Creatinine, Ser 02/21/2013 0.75  0.50 - 1.10 mg/dL Final  . Calcium 54/01/8118 10.2  8.4 - 10.5 mg/dL Final  . Total Protein 02/21/2013 7.3  6.0 - 8.3 g/dL Final  . Albumin 14/78/2956 4.0  3.5 - 5.2 g/dL Final  . AST 21/30/8657 13  0 - 37 U/L Final  . ALT 02/21/2013 7  0 - 35 U/L Final  . Alkaline Phosphatase 02/21/2013 88  39 - 117 U/L Final  . Total Bilirubin 02/21/2013 0.4  0.3 - 1.2 mg/dL Final  . GFR calc non Af Amer 02/21/2013 >90  >90 mL/min Final  . GFR calc Af Amer 02/21/2013 >90  >90 mL/min Final   Comment: (NOTE)                          The eGFR has been calculated using the CKD EPI equation.                          This calculation has not been validated in all clinical situations.                          eGFR's persistently <90 mL/min signify possible Chronic Kidney                          Disease.  Bethany Michael [phone]) Thyroid Nodule Aspirate: Patient:  Bethany Michael, Bethany Michael Collected: 12/25/2012 Client: Frederich Cha Accession: [id number] Received: 12/25/2012 D. Oley Balm, MD DOB: Jun 20, 1960 Age: 76 Gender: F Reported: 12/26/2012 [address] Patient Ph: [phone] MRN#: [id number] [place], [place] [address] Client Acc#: Chart: Phone: [phone] Fax: LMP: Visit#: [id number].[id number] CC: Adolph Pollack, MD Dellis Anes, PA-C CYTOPATHOLOGY REPORT Adequacy Reason Satisfactory For Evaluation. Diagnosis THYROID, FINE NEEDLE ASPIRATION RT LOBE, RMP: BENIGN. FINDINGS CONSISTENT WITH NON-NEOPLASTIC GOITER. [place] RUND DO Pathologist, Electronic Signature (Case signed 12/26/2012) Specimen Clinical Information Heterogeneous nodule 9 x 12 x 6 mm, History of hypermetabolic right thyroid nodule on prior PET CT, History of breast cancer and bilateral mastectomies Source Thyroid, Fine Needle Aspiration, Rt Lobe, RMP Gross Specimen: Received is/are 30cc's of red cytolyt solution and 6 slides in 95% ethyl alcohol. (PH:ph) Prepared: # Smears: 6 # Concentration Technique Slides (i.e. ThinPrep): 1 # Cell Block: 1 Cellient Additional Studies: N/A   Urinalysis No results found for this basename: colorurine,  appearanceur,  labspec,  phurine,  glucoseu,  hgbur,  bilirubinur,  ketonesur,  proteinur,  urobilinogen,  nitrite,  leukocytesur    RADIOGRAPHIC STUDIES: No results found.  ASSESSMENT: #1. Locally advanced breast cancer on the left approximately 4 cm in size clinically ER, PR negative Ki-67 are her 50% HER-2/neu not amplified. MRI revealed a 4.9 x 4.9 x 3.9 cm mass about seen the pectoralis fascia with skin thickening but no obvious axillary nodes. She also had atypical cells and a small nodule in the right breast in the retroareolar area . She is SP FEC x 6 cycles (11/21/11- 01/31/12) followed by 4 cycles of Docetaxel (02/20/12- 04/02/12). This was followed by B/L mastectomies by Dr. Abbey Chatters on 05/03/2012) with negative margins  and micrometastatic carcinoma in 1 lymph node. S/P radiation by Dr. Basilio Cairo beginning in Feb 2014 and ending 08/23/2012, no evidence of disease. #2. Peripheral neuropathy, lower  70s worse than upper extremities. #3. Status post bilateral mastectomies, interested in reconstruction.    PLAN: #1. Close surveillance with lab tests, history and physical examination every 3 months for 2 years. #2. Referral to Dr. Tobie Lords, plastic surgery, for consideration of reconstruction. #3. Followup in 3 months.   All questions were answered. The patient knows to call the clinic with any problems, questions or concerns. We can certainly see the patient much sooner if necessary.   I spent 30 minutes counseling the patient face to face. The total time spent in the appointment was 25 minutes.    Maurilio Lovely, MD 02/21/2013 3:36 PM

## 2013-02-21 NOTE — Patient Instructions (Addendum)
[facility] Discharge Instructions  RECOMMENDATIONS MADE BY THE CONSULTANT AND ANY TEST RESULTS WILL BE SENT TO YOUR REFERRING PHYSICIAN.  EXAM FINDINGS BY THE PHYSICIAN TODAY AND SIGNS OR SYMPTOMS TO REPORT TO CLINIC OR PRIMARY PHYSICIAN: Exam and findings as discussed by Dr. Zigmund Daniel.  You are doing well.  Report any new lumps, bone pain, shortness of breath or other symptoms.  Will make referral to plastic surgeon for evaluation.  MEDICATIONS PRESCRIBED:  none  INSTRUCTIONS/FOLLOW-UP: Follow-up in 3 months with lab work and MD visit.  Thank you for choosing [facility] to provide your oncology and hematology care.  To afford each patient quality time with our providers, please arrive at least 15 minutes before your scheduled appointment time.  With your help, our goal is to use those 15 minutes to complete the necessary work-up to ensure our physicians have the information they need to help with your evaluation and healthcare recommendations.    Effective January 1st, 2014, we ask that you re-schedule your appointment with our physicians should you arrive 10 or more minutes late for your appointment.  We strive to give you quality time with our providers, and arriving late affects you and other patients whose appointments are after yours.    Again, thank you for choosing [facility].  Our hope is that these requests will decrease the amount of time that you wait before being seen by our physicians.       _____________________________________________________________  Should you have questions after your visit to [facility], please contact our office at [phone] between the hours of 8:30 a.m. and 5:00 p.m.  Voicemails left after 4:30 p.m. will not be returned until the following business day.  For prescription refill requests, have your pharmacy contact our office with your prescription refill request.

## 2013-02-22 LAB — CANCER ANTIGEN 27.29: CA 27.29: 29 U/mL (ref 0–39)

## 2013-02-22 LAB — CEA: CEA: 0.8 ng/mL (ref 0.0–5.0)

## 2013-04-01 ENCOUNTER — Encounter (HOSPITAL_COMMUNITY): Payer: Self-pay | Admitting: Pharmacy Technician

## 2013-04-04 NOTE — Pre-Procedure Instructions (Signed)
Bethany Michael  04/04/2013   Your procedure is scheduled on:  04/15/2013  Report to [facility]  2 * 3 at 930 AM.  Call this number if you have problems the morning of surgery: [phone]   Remember:   Do not eat food or drink liquids after midnight.   Take these medicines the morning of surgery with A SIP OF WATER: none   Do not wear jewelry, make-up or nail polish.  Do not wear lotions, powders, or perfumes. You may wear deodorant.  Do not shave 48 hours prior to surgery. Men may shave face and neck.  Do not bring valuables to the hospital.  [facility] is not responsible                  for any belongings or valuables.               Contacts, dentures or bridgework may not be worn into surgery.  Leave suitcase in the car. After surgery it may be brought to your room.  For patients admitted to the hospital, discharge time is determined by your                treatment team.               Patients discharged the day of surgery will not be allowed to drive  home.  Name and phone number of your driver: family  Special Instructions: Shower using CHG 2 nights before surgery and the night before surgery.  If you shower the day of surgery use CHG.  Use special wash - you have one bottle of CHG for all showers.  You should use approximately 1/3 of the bottle for each shower. N/A   Please read over the following fact sheets that you were given: Pain Booklet, Coughing and Deep Breathing and Surgical Site Infection Prevention

## 2013-04-05 ENCOUNTER — Other Ambulatory Visit: Payer: Self-pay | Admitting: Plastic Surgery

## 2013-04-05 ENCOUNTER — Encounter (HOSPITAL_COMMUNITY): Payer: Self-pay

## 2013-04-05 ENCOUNTER — Encounter (HOSPITAL_COMMUNITY)
Admission: RE | Admit: 2013-04-05 | Discharge: 2013-04-05 | Disposition: A | Discharge status: Home / Self Care | Payer: Medicaid Other | Source: Ambulatory Visit | Attending: Plastic Surgery | Admitting: Plastic Surgery

## 2013-04-05 VITALS — BP 105/70 | HR 84 | Temp 98.1°F | Resp 18 | Ht 67.0 in | Wt 152.2 lb

## 2013-04-05 DIAGNOSIS — Z9013 Acquired absence of bilateral breasts and nipples: Secondary | ICD-10-CM

## 2013-04-05 DIAGNOSIS — Z01812 Encounter for preprocedural laboratory examination: Secondary | ICD-10-CM | POA: Insufficient documentation

## 2013-04-05 HISTORY — DX: Other specified postprocedural states: Z98.890

## 2013-04-05 HISTORY — DX: Other specified postprocedural states: R11.2

## 2013-04-05 LAB — CBC
HCT: 40.9 % (ref 36.0–46.0)
Hemoglobin: 14.3 g/dL (ref 12.0–15.0)
MCH: 32.3 pg (ref 26.0–34.0)
Platelets: 230 10*3/uL (ref 150–400)
RBC: 4.43 MIL/uL (ref 3.87–5.11)
WBC: 5.2 10*3/uL (ref 4.0–10.5)

## 2013-04-05 NOTE — H&P (Signed)
This document contains confidential information from a [facility] medical record system and may be unauthenticated. Release may be made only with a valid authorization or in accordance with applicable policies of Medical Center or its affiliates. This document must be maintained in a secure manner or discarded/destroyed as required by Medical Center policy or by a confidential means such as shredding.     [name]  03/26/2013 2:15 PM   Office Visit  MRN:  [id number]   Department: [facility]  Dept Phone: [phone]   Description: Female DOB: 03-May-1961  Provider: Wayland Denis, DO    Diagnoses    Breast cancer, left (HCC)    -  Primary   174.9     Reason for Visit -  Breast Reconstruction   Vitals - Last Recorded    101/65  81  98.3 F (36.8 C)  1.702 m (5\' 7" )  68.493 kg (151 lb)  23.64 kg/m2    Subjective:     Patient ID: Bethany Michael is a 52 y.o. female.  HPI The patient is a 52 yrs old female here for a history and physical for bilateral breast reconstruction. She was diagnosed with advanced left breast cancer, ER/PR negative, Ki-67 are her 50% HER-2/neu not amplified. MRI revealed a 4.9 x 4.9 x 3.9 cm mass seen at the pectoralis fascia with skin thickening but no obvious axillary nodes. She also had atypical cells and a small nodule in the right breast in the retroareolar area and wants to proceed with bilateral mastectomies. She was treated with FEC x6 cycles and has had 3 cycles of Taxotere. She is seeing Dr. Abbey Chatters for the mastectomies. There may be a 1 1/2 cm nodule in the superior portion of the left breast at the 12:00 position. The right breast is negative for any masses. The right-sided Port-A-Cath is intact. She initially wanted to delay surgery till after Christmas but this is discouraged by all involved. There is a right thyroid nodule being worked up by Dr. Abbey Chatters. Her radiation to the left breast ended March 2014. She has a c-section  scar that has well healed. She is 5 feet 7 inches tall, weighs 151 pounds and wore a 34B bra.  The following portions of the patient's history were reviewed and updated as appropriate: allergies, current medications, past family history, past medical history, past social history, past surgical history and problem list.  Review of Systems  Constitutional: Negative.   HENT: Negative.   Eyes: Negative.   Respiratory: Negative.   Cardiovascular: Negative.   Gastrointestinal: Negative.   Endocrine: Negative.   Genitourinary: Negative.   Neurological: Negative.   Hematological: Negative.   Psychiatric/Behavioral: Negative.        Objective:    Physical Exam  Constitutional: She appears well-developed and well-nourished.  HENT:   Head: Normocephalic and atraumatic.  Eyes: Conjunctivae and EOM are normal. Pupils are equal, round, and reactive to light.  Neck: Normal range of motion.  Cardiovascular: Normal rate.   Pulmonary/Chest: Effort normal.  Abdominal: Soft.  Musculoskeletal: Normal range of motion.  Neurological: She is alert.  Skin: Skin is warm.  Psychiatric: She has a normal mood and affect. Her behavior is normal. Judgment and thought content normal.      Assessment:      1.  Breast cancer, left (HCC)        Plan:     We are planning on doing a left latissimus/expander reconstruction on the left and expander/Flex HD  on the right.  The consent was obtained with risks and complications reviewed which included bleeding, pain, scar, infection and the risk of anesthesia.  The patients questions were answered to the patients expressed satisfaction.  Medications Ordered This Encounter     cephalexin (KEFLEX) 500 MG  Take 1 capsule (500 mg total) by mouth 4 times daily.    diazepam (VALIUM) 2 MG tablet Take 1 tablet (2 mg total) by mouth every 6 (six) hours as needed for up to 10 days for Anxiety.    ondansetron (ZOFRAN, AS HYDROCHLORIDE,) 4 MG tablet 10 Take 1 tablet (4 mg total)  by mouth every 8 (eight) hours as needed for up to 7 days for Nausea / Vomiting.     HYDROcodone-acetaminophen (NORCO) 5-325 mg per tablet Take 1 tablet by mouth every 6 (six) hours as needed for up to 10 days for Pain.      Discontinued Medications      pseudoephedrine-guaifenesin (MUCINEX D) 60-600 mg per tablet Completed Therapy

## 2013-04-14 MED ORDER — CEFAZOLIN SODIUM-DEXTROSE 2-3 GM-% IV SOLR: 2.0000 g | INTRAVENOUS | Status: DC

## 2013-04-15 ENCOUNTER — Other Ambulatory Visit: Payer: Self-pay | Admitting: Plastic Surgery

## 2013-04-15 ENCOUNTER — Encounter (HOSPITAL_COMMUNITY): Payer: Medicaid Other | Admitting: Certified Registered Nurse Anesthetist

## 2013-04-15 ENCOUNTER — Encounter (HOSPITAL_COMMUNITY)
Admission: RE | Disposition: A | Discharge status: Home / Self Care | Payer: Self-pay | Source: Ambulatory Visit | Attending: Plastic Surgery

## 2013-04-15 ENCOUNTER — Encounter (HOSPITAL_COMMUNITY): Payer: Self-pay | Admitting: *Deleted

## 2013-04-15 ENCOUNTER — Inpatient Hospital Stay (HOSPITAL_COMMUNITY)
Admission: RE | Admit: 2013-04-15 | Discharge: 2013-04-17 | DRG: 585 | Disposition: A | Discharge status: Home / Self Care | Payer: Medicaid Other | Source: Ambulatory Visit | Attending: Plastic Surgery | Admitting: Plastic Surgery

## 2013-04-15 ENCOUNTER — Inpatient Hospital Stay (HOSPITAL_COMMUNITY): Payer: Medicaid Other | Admitting: Certified Registered Nurse Anesthetist

## 2013-04-15 DIAGNOSIS — Z9013 Acquired absence of bilateral breasts and nipples: Secondary | ICD-10-CM

## 2013-04-15 DIAGNOSIS — Z421 Encounter for breast reconstruction following mastectomy: Principal | ICD-10-CM

## 2013-04-15 DIAGNOSIS — Z853 Personal history of malignant neoplasm of breast: Secondary | ICD-10-CM

## 2013-04-15 HISTORY — PX: BREAST RECONSTRUCTION WITH PLACEMENT OF TISSUE EXPANDER AND FLEX HD (ACELLULAR HYDRATED DERMIS): SHX6295

## 2013-04-15 HISTORY — PX: LATISSIMUS FLAP TO BREAST: SHX5357

## 2013-04-15 LAB — BASIC METABOLIC PANEL
BUN: 20 mg/dL (ref 6–23)
BUN: 13 mg/dL (ref 6–23)
CO2: 26 mEq/L (ref 19–32)
Calcium: 9.9 mg/dL (ref 8.4–10.5)
Calcium: 8.9 mg/dL (ref 8.4–10.5)
GFR calc Af Amer: >90 mL/min (ref >90)
GFR calc non Af Amer: >90 mL/min (ref >90)
Glucose, Bld: 94 mg/dL (ref 70–99)
Potassium: 4.4 mEq/L (ref 3.5–5.1)
Potassium: 3.7 mEq/L (ref 3.5–5.1)
Sodium: 139 mEq/L (ref 135–145)
Sodium: 135 mEq/L (ref 135–145)

## 2013-04-15 LAB — CBC
Hemoglobin: 12.1 g/dL (ref 12.0–15.0)
Platelets: 162 10*3/uL (ref 150–400)
RBC: 3.81 MIL/uL — ABNORMAL LOW (ref 3.87–5.11)
WBC: 11 10*3/uL — ABNORMAL HIGH (ref 4.0–10.5)

## 2013-04-15 SURGERY — RECONSTRUCTION, BREAST, USING LATISSIMUS DORSI MYOCUTANEOUS FLAP
Anesthesia: General | Site: Breast | Laterality: Right | Wound class: Clean

## 2013-04-15 MED ORDER — HYDROMORPHONE HCL PF 1 MG/ML IJ SOLN
0.2500 mg | INTRAMUSCULAR | Status: DC | PRN
  Administered 2013-04-15 (×3): 0.5 mg via INTRAVENOUS

## 2013-04-15 MED ORDER — KCL IN DEXTROSE-NACL 20-5-0.45 MEQ/L-%-% IV SOLN
INTRAVENOUS | Status: AC
  Filled 2013-04-15: qty 1000

## 2013-04-15 MED ORDER — HEPARIN SOD (PORK) LOCK FLUSH 100 UNIT/ML IV SOLN
INTRAVENOUS | Status: AC
  Filled 2013-04-15: qty 5

## 2013-04-15 MED ORDER — OXYCODONE HCL ER 10 MG PO T12A
10.0000 mg | EXTENDED_RELEASE_TABLET | Freq: Two times a day (BID) | ORAL | Status: DC
  Filled 2013-04-15 (×3): qty 1

## 2013-04-15 MED ORDER — CEFAZOLIN SODIUM-DEXTROSE 2-3 GM-% IV SOLR
2.0000 g | INTRAVENOUS | Status: AC
  Administered 2013-04-15: 2 g via INTRAVENOUS
  Filled 2013-04-15: qty 50

## 2013-04-15 MED ORDER — SCOPOLAMINE 1 MG/3DAYS TD PT72
MEDICATED_PATCH | TRANSDERMAL | Status: AC
  Administered 2013-04-15: 1.5 mg
  Filled 2013-04-15: qty 1

## 2013-04-15 MED ORDER — ONDANSETRON HCL 4 MG/2ML IJ SOLN
4.0000 mg | Freq: Four times a day (QID) | INTRAMUSCULAR | Status: DC | PRN
  Administered 2013-04-15: 4 mg via INTRAVENOUS
  Filled 2013-04-15: qty 2

## 2013-04-15 MED ORDER — FENTANYL CITRATE 0.05 MG/ML IJ SOLN
INTRAMUSCULAR | Status: DC | PRN
  Administered 2013-04-15: 25 ug via INTRAVENOUS
  Administered 2013-04-15: 150 ug via INTRAVENOUS
  Administered 2013-04-15 (×3): 50 ug via INTRAVENOUS
  Administered 2013-04-15: 25 ug via INTRAVENOUS
  Administered 2013-04-15: 50 ug via INTRAVENOUS

## 2013-04-15 MED ORDER — OXYCODONE HCL 5 MG/5ML PO SOLN: 5.0000 mg | Freq: Once | ORAL | Status: DC | PRN

## 2013-04-15 MED ORDER — BUPIVACAINE-EPINEPHRINE PF 0.25-1:200000 % IJ SOLN
INTRAMUSCULAR | Status: AC
  Filled 2013-04-15: qty 30

## 2013-04-15 MED ORDER — EVICEL 5 ML EX KIT
PACK | CUTANEOUS | Status: AC
  Filled 2013-04-15: qty 1

## 2013-04-15 MED ORDER — LIDOCAINE HCL (CARDIAC) 20 MG/ML IV SOLN
INTRAVENOUS | Status: DC | PRN
  Administered 2013-04-15: 60 mg via INTRAVENOUS

## 2013-04-15 MED ORDER — KCL IN DEXTROSE-NACL 20-5-0.45 MEQ/L-%-% IV SOLN
INTRAVENOUS | Status: DC
  Administered 2013-04-15 – 2013-04-16 (×2): via INTRAVENOUS
  Filled 2013-04-15 (×5): qty 1000

## 2013-04-15 MED ORDER — PROPOFOL 10 MG/ML IV BOLUS
INTRAVENOUS | Status: DC | PRN
  Administered 2013-04-15: 140 mg via INTRAVENOUS

## 2013-04-15 MED ORDER — DOCUSATE SODIUM 100 MG PO CAPS
100.0000 mg | ORAL_CAPSULE | Freq: Three times a day (TID) | ORAL | Status: DC
  Administered 2013-04-15 – 2013-04-17 (×5): 100 mg via ORAL
  Filled 2013-04-15 (×4): qty 1

## 2013-04-15 MED ORDER — SCOPOLAMINE 1 MG/3DAYS TD PT72: 1.0000 | MEDICATED_PATCH | TRANSDERMAL | Status: DC

## 2013-04-15 MED ORDER — DIPHENHYDRAMINE HCL 50 MG/ML IJ SOLN: 12.5000 mg | Freq: Four times a day (QID) | INTRAMUSCULAR | Status: DC | PRN

## 2013-04-15 MED ORDER — AMPICILLIN-SULBACTAM SODIUM 3 (2-1) G IJ SOLR
3.0000 g | Freq: Four times a day (QID) | INTRAMUSCULAR | Status: DC
  Administered 2013-04-15 – 2013-04-17 (×7): 3 g via INTRAVENOUS
  Filled 2013-04-15 (×9): qty 3

## 2013-04-15 MED ORDER — SCOPOLAMINE 1 MG/3DAYS TD PT72
1.0000 | MEDICATED_PATCH | TRANSDERMAL | Status: DC
  Filled 2013-04-15: qty 1

## 2013-04-15 MED ORDER — MORPHINE SULFATE (PF) 1 MG/ML IV SOLN
INTRAVENOUS | Status: DC
  Administered 2013-04-15: 1 mg via INTRAVENOUS
  Administered 2013-04-15: 16:00:00 via INTRAVENOUS
  Administered 2013-04-16: 2 mg via INTRAVENOUS

## 2013-04-15 MED ORDER — HYDROMORPHONE HCL PF 1 MG/ML IJ SOLN
INTRAMUSCULAR | Status: AC
  Administered 2013-04-15: 0.5 mg via INTRAVENOUS
  Filled 2013-04-15: qty 1

## 2013-04-15 MED ORDER — DIPHENHYDRAMINE HCL 12.5 MG/5ML PO ELIX: 12.5000 mg | ORAL_SOLUTION | Freq: Four times a day (QID) | ORAL | Status: DC | PRN

## 2013-04-15 MED ORDER — SODIUM CHLORIDE 0.9 % IJ SOLN: 9.0000 mL | INTRAMUSCULAR | Status: DC | PRN

## 2013-04-15 MED ORDER — OXYCODONE HCL 5 MG PO TABS: 5.0000 mg | ORAL_TABLET | Freq: Once | ORAL | Status: DC | PRN

## 2013-04-15 MED ORDER — ONDANSETRON HCL 4 MG/2ML IJ SOLN
INTRAMUSCULAR | Status: DC | PRN
  Administered 2013-04-15: 4 mg via INTRAVENOUS

## 2013-04-15 MED ORDER — NALOXONE HCL 0.4 MG/ML IJ SOLN: 0.4000 mg | INTRAMUSCULAR | Status: DC | PRN

## 2013-04-15 MED ORDER — LACTATED RINGERS IV SOLN
INTRAVENOUS | Status: DC | PRN
  Administered 2013-04-15 (×3): via INTRAVENOUS

## 2013-04-15 MED ORDER — ROCURONIUM BROMIDE 100 MG/10ML IV SOLN
INTRAVENOUS | Status: DC | PRN
  Administered 2013-04-15 (×2): 10 mg via INTRAVENOUS
  Administered 2013-04-15: 50 mg via INTRAVENOUS

## 2013-04-15 MED ORDER — BUPIVACAINE HCL (PF) 0.25 % IJ SOLN
INTRAMUSCULAR | Status: AC
  Filled 2013-04-15: qty 30

## 2013-04-15 MED ORDER — MORPHINE SULFATE (PF) 1 MG/ML IV SOLN
INTRAVENOUS | Status: AC
  Filled 2013-04-15: qty 25

## 2013-04-15 MED ORDER — PROMETHAZINE HCL 25 MG/ML IJ SOLN: 6.2500 mg | INTRAMUSCULAR | Status: DC | PRN

## 2013-04-15 MED ORDER — LIDOCAINE-EPINEPHRINE (PF) 1 %-1:200000 IJ SOLN
INTRAMUSCULAR | Status: AC
  Filled 2013-04-15: qty 10

## 2013-04-15 MED ORDER — DEXAMETHASONE SODIUM PHOSPHATE 4 MG/ML IJ SOLN
INTRAMUSCULAR | Status: DC | PRN
  Administered 2013-04-15: 8 mg via INTRAVENOUS

## 2013-04-15 MED ORDER — ALBUMIN HUMAN 5 % IV SOLN
INTRAVENOUS | Status: DC | PRN
  Administered 2013-04-15: 13:00:00 via INTRAVENOUS

## 2013-04-15 MED ORDER — PHENYLEPHRINE HCL 10 MG/ML IJ SOLN
INTRAMUSCULAR | Status: DC | PRN
  Administered 2013-04-15: 80 ug via INTRAVENOUS

## 2013-04-15 MED ORDER — BUPIVACAINE-EPINEPHRINE 0.25% -1:200000 IJ SOLN
INTRAMUSCULAR | Status: DC | PRN
  Administered 2013-04-15: 18 mL

## 2013-04-15 MED ORDER — LACTATED RINGERS IV SOLN
INTRAVENOUS | Status: DC
  Administered 2013-04-15: 11:00:00 via INTRAVENOUS

## 2013-04-15 MED ORDER — DIAZEPAM 2 MG PO TABS: 2.0000 mg | ORAL_TABLET | Freq: Two times a day (BID) | ORAL | Status: DC | PRN

## 2013-04-15 MED ORDER — SODIUM CHLORIDE 0.9 % IR SOLN
Status: DC | PRN
  Administered 2013-04-15: 13:00:00

## 2013-04-15 MED ORDER — MORPHINE SULFATE (PF) 1 MG/ML IV SOLN
INTRAVENOUS | Status: DC
  Administered 2013-04-15: 1 mg via INTRAVENOUS
  Administered 2013-04-16: 4 mg via INTRAVENOUS

## 2013-04-15 MED ORDER — HYDROMORPHONE HCL PF 1 MG/ML IJ SOLN
INTRAMUSCULAR | Status: AC
  Filled 2013-04-15: qty 1

## 2013-04-15 MED ORDER — EPHEDRINE SULFATE 50 MG/ML IJ SOLN
INTRAMUSCULAR | Status: DC | PRN
  Administered 2013-04-15 (×4): 5 mg via INTRAVENOUS

## 2013-04-15 MED ORDER — ONDANSETRON HCL 4 MG/2ML IJ SOLN: 4.0000 mg | Freq: Four times a day (QID) | INTRAMUSCULAR | Status: DC | PRN

## 2013-04-15 MED ORDER — EVICEL 5 ML EX KIT
PACK | CUTANEOUS | Status: DC | PRN
  Administered 2013-04-15 (×2): 5 mL

## 2013-04-15 MED ORDER — MIDAZOLAM HCL 5 MG/5ML IJ SOLN
INTRAMUSCULAR | Status: DC | PRN
  Administered 2013-04-15: 2 mg via INTRAVENOUS

## 2013-04-15 SURGICAL SUPPLY — 78 items
APPLIER CLIP 9.375 MED OPEN (MISCELLANEOUS) ×3
BAG DECANTER FOR FLEXI CONT (MISCELLANEOUS) ×3 IMPLANT
BANDAGE GAUZE ELAST BULKY 4 IN (GAUZE/BANDAGES/DRESSINGS) ×3 IMPLANT
BINDER BREAST LRG (GAUZE/BANDAGES/DRESSINGS) IMPLANT
BINDER BREAST MEDIUM (GAUZE/BANDAGES/DRESSINGS) ×3 IMPLANT
BINDER BREAST XLRG (GAUZE/BANDAGES/DRESSINGS) IMPLANT
BIOPATCH RED 1 DISK 7.0 (GAUZE/BANDAGES/DRESSINGS) ×12 IMPLANT
BLADE SURG 10 STRL SS (BLADE) ×3 IMPLANT
BLADE SURG 15 STRL LF DISP TIS (BLADE) ×2 IMPLANT
BLADE SURG 15 STRL SS (BLADE) ×1
CANISTER SUCTION 2500CC (MISCELLANEOUS) ×3 IMPLANT
CHLORAPREP W/TINT 26ML (MISCELLANEOUS) ×6 IMPLANT
CLIP APPLIE 9.375 MED OPEN (MISCELLANEOUS) ×2 IMPLANT
COVER SURGICAL LIGHT HANDLE (MISCELLANEOUS) ×3 IMPLANT
DERMABOND ADVANCED (GAUZE/BANDAGES/DRESSINGS) ×2
DERMABOND ADVANCED .7 DNX12 (GAUZE/BANDAGES/DRESSINGS) ×4 IMPLANT
DRAIN CHANNEL 19F RND (DRAIN) ×12 IMPLANT
DRAPE INCISE IOBAN 85X60 (DRAPES) ×3 IMPLANT
DRAPE ORTHO SPLIT 77X108 STRL (DRAPES) ×2
DRAPE PROXIMA HALF (DRAPES) ×6 IMPLANT
DRAPE SURG 17X23 STRL (DRAPES) ×12 IMPLANT
DRAPE SURG ORHT 6 SPLT 77X108 (DRAPES) ×4 IMPLANT
DRAPE WARM FLUID 44X44 (DRAPE) ×3 IMPLANT
DRSG MEPILEX BORDER 4X8 (GAUZE/BANDAGES/DRESSINGS) ×3 IMPLANT
DRSG PAD ABDOMINAL 8X10 ST (GAUZE/BANDAGES/DRESSINGS) ×3 IMPLANT
ELECT BLADE 4.0 EZ CLEAN MEGAD (MISCELLANEOUS) ×3
ELECT BLADE 6.5 EXT (BLADE) ×3 IMPLANT
ELECT CAUTERY BLADE 6.4 (BLADE) ×9 IMPLANT
ELECT REM PT RETURN 9FT ADLT (ELECTROSURGICAL) ×3
ELECTRODE BLDE 4.0 EZ CLN MEGD (MISCELLANEOUS) ×2 IMPLANT
ELECTRODE REM PT RTRN 9FT ADLT (ELECTROSURGICAL) ×2 IMPLANT
EVACUATOR SILICONE 100CC (DRAIN) ×12 IMPLANT
GAUZE XEROFORM 5X9 LF (GAUZE/BANDAGES/DRESSINGS) ×3 IMPLANT
GLOVE BIO SURGEON STRL SZ 6.5 (GLOVE) ×15 IMPLANT
GLOVE BIO SURGEON STRL SZ7.5 (GLOVE) ×3 IMPLANT
GLOVE BIOGEL PI IND STRL 6.5 (GLOVE) ×2 IMPLANT
GLOVE BIOGEL PI IND STRL 7.0 (GLOVE) ×4 IMPLANT
GLOVE BIOGEL PI IND STRL 7.5 (GLOVE) ×2 IMPLANT
GLOVE BIOGEL PI INDICATOR 6.5 (GLOVE) ×1
GLOVE BIOGEL PI INDICATOR 7.0 (GLOVE) ×2
GLOVE BIOGEL PI INDICATOR 7.5 (GLOVE) ×1
GLOVE SURG SS PI 6.0 STRL IVOR (GLOVE) ×3 IMPLANT
GLOVE SURG SS PI 7.0 STRL IVOR (GLOVE) ×6 IMPLANT
GOWN PREVENTION PLUS XXLARGE (GOWN DISPOSABLE) ×3 IMPLANT
GOWN STRL NON-REIN LRG LVL3 (GOWN DISPOSABLE) ×12 IMPLANT
GRAFT FLEX HD 4X16 THICK (Tissue Mesh) ×3 IMPLANT
IMPL BREAST TIS EXP M 350CC (Breast) ×4 IMPLANT
IMPLANT BREAST TIS EXP M 350CC (Breast) ×6 IMPLANT
KIT BASIN OR (CUSTOM PROCEDURE TRAY) ×3 IMPLANT
KIT ROOM TURNOVER OR (KITS) ×3 IMPLANT
NS IRRIG 1000ML POUR BTL (IV SOLUTION) ×6 IMPLANT
PACK GENERAL/GYN (CUSTOM PROCEDURE TRAY) ×3 IMPLANT
PAD ARMBOARD 7.5X6 YLW CONV (MISCELLANEOUS) ×9 IMPLANT
PENCIL BUTTON HOLSTER BLD 10FT (ELECTRODE) ×3 IMPLANT
PIN SAFETY STERILE (MISCELLANEOUS) ×3 IMPLANT
SET ASEPTIC TRANSFER (MISCELLANEOUS) ×3 IMPLANT
SPONGE GAUZE 4X4 12PLY (GAUZE/BANDAGES/DRESSINGS) ×3 IMPLANT
SPONGE LAP 18X18 X RAY DECT (DISPOSABLE) ×3 IMPLANT
STAPLER VISISTAT 35W (STAPLE) ×3 IMPLANT
STOPCOCK 4 WAY LG BORE MALE ST (IV SETS) IMPLANT
SUT ETHILON 2 0 FS 18 (SUTURE) ×6 IMPLANT
SUT MNCRL AB 3-0 PS2 18 (SUTURE) ×18 IMPLANT
SUT MNCRL AB 4-0 PS2 18 (SUTURE) ×12 IMPLANT
SUT MON AB 5-0 PS2 18 (SUTURE) ×12 IMPLANT
SUT PDS AB 2-0 CT1 27 (SUTURE) ×15 IMPLANT
SUT SILK 2 0 FS (SUTURE) ×3 IMPLANT
SUT SILK 3 0 SH 30 (SUTURE) ×9 IMPLANT
SUT VIC AB 3-0 PS2 18 (SUTURE) ×4
SUT VIC AB 3-0 PS2 18XBRD (SUTURE) ×8 IMPLANT
SUT VIC AB 3-0 SH 27 (SUTURE) ×3
SUT VIC AB 3-0 SH 27X BRD (SUTURE) ×6 IMPLANT
SUT VIC AB 3-0 SH 8-18 (SUTURE) ×3 IMPLANT
SUT VICRYL 4-0 PS2 18IN ABS (SUTURE) ×6 IMPLANT
SYR BULB IRRIGATION 50ML (SYRINGE) ×3 IMPLANT
SYR CONTROL 10ML LL (SYRINGE) ×3 IMPLANT
TOWEL OR 17X24 6PK STRL BLUE (TOWEL DISPOSABLE) ×9 IMPLANT
TOWEL OR 17X26 10 PK STRL BLUE (TOWEL DISPOSABLE) ×3 IMPLANT
TRAY FOLEY CATH 14FRSI W/METER (CATHETERS) ×3 IMPLANT

## 2013-04-15 NOTE — Anesthesia Preprocedure Evaluation (Addendum)
Anesthesia Evaluation  Patient identified by MRN, date of birth, ID band Patient awake    Reviewed: Allergy & Precautions, H&P , NPO status , Patient's Chart, lab work & pertinent test results  History of Anesthesia Complications (+) PONV and history of anesthetic complications  Airway Mallampati: II  Neck ROM: full  Mouth opening: Limited Mouth Opening  Dental  (+) Teeth Intact and Dental Advisory Given   Pulmonary neg pulmonary ROS,          Cardiovascular negative cardio ROS      Neuro/Psych    GI/Hepatic   Endo/Other  Hyperthyroidism   Renal/GU      Musculoskeletal   Abdominal   Peds  Hematology   Anesthesia Other Findings   Reproductive/Obstetrics H/o breast CA                          Anesthesia Physical Anesthesia Plan  ASA: II  Anesthesia Plan: General   Post-op Pain Management:    Induction: Intravenous  Airway Management Planned: Oral ETT  Additional Equipment:   Intra-op Plan:   Post-operative Plan: Extubation in OR  Informed Consent: I have reviewed the patients History and Physical, chart, labs and discussed the procedure including the risks, benefits and alternatives for the proposed anesthesia with the patient or authorized representative who has indicated his/her understanding and acceptance.   Dental advisory given  Plan Discussed with: CRNA, Anesthesiologist and Surgeon  Anesthesia Plan Comments:        Anesthesia Quick Evaluation

## 2013-04-15 NOTE — Op Note (Addendum)
DATE OF OPERATION: 04/15/2013  PREOPERATIVE DIAGNOSIS: Breast Cancer s/p bilateral mastectomies   POSTOPERATIVE DIAGNOSIS: Same  PROCEDURE: 1. Latissimus flap to reconstruct the left breast  2. Tissue expander placement to the left breast 3. Tissue expander and FlexHD placement to right breast  SURGEON: Alan Ripper Sanger  ASSISTANT: Shawn Rayburn, PA  EBL: 200 cc  SPECIMEN: None  DRAINS: 4 total 19 blake round drains  CONDITION: Stable  COMPLICATIONS: None  INDICATION: The patient, Bethany Michael, is a 52 y.o. female born on 1960-07-10, is here for treatment after a bilateral mastectomy.    PROCEDURE DETAILS:  Patient was seen on the morning of her surgery and marked out for her flap. She was then taken to the operating room and given a general anesthetic. She was given an IV and IV antibiotics. An adequate time out was done. She has SCD's and a foley catheter placed. She was placed into the right lateral decubitus position with all key points padded. She was then prepped and draped in the standard sterile fashion using a chloroprep. The paddle design and position was confirmed. The procedure began by incising the margins of the paddle and dissecting out until all four margins of the muscle were identified. The muscle was then released inferiorly and anteriorly and care was taken not to pick up the serratus anteriorly or the paraspinous muscles posteriorly. The flap was then raised to the scapula and released and rotated medially. Care was taken to protect the vascular pedicle throughout this portion of the procedure. The paddle and muscle looked healthy throughout. I then opened the old mastectomy scar and raised the flaps on top of the pectoralis muscle as there was a reasonable amount of skin. The posterior and anterior pockets were then connected in the plane above the muscle. The muscle from the back and the skin paddle were then rotated into the chest pocket and tacked in place with staples and  covered with an ioban dressing. The flap pedicle was inspected and there was no tension. The back pocket was hemostased and Evicel placed. Two #19 blake round drains were place and secured with 3-0 Silk. The back incision was closed with buried 3-0 Vicryl, followed by 4-0 Monocryl and 5-0 Monocryl.  Dermabond and a protective dressing was applied.   The patient was then repositioned onto her back and the chest was prepped and draped with a betadine. The breast pocket was inspected and hemostases was achieved with electrocautery. The muscle was then secured superiorly to the pectoralis muscle with 3-0 Monocryl. A 350 cc expander was chosen. It was soaked in triple antibiotic solution and evacuated of air and then filled with 50 cc of sterile saline. The inferior portion of the muscle was tacked to the inframammary fold with 3-0 Monocryl.  One drain was placed on this side and secured with 3-0 Silk. The flap was then closed with 3-0 Monocryl deep, followed by 4-0 Monocryl and the skin closed with 5-0 Monocryl.  Attention was then turned to the right breast.  The previous skin incision was incised and the pectoralis muscle was exposed and the flaps lifted off the pectoralis for the superior and inferior flaps.  The pectoralis muscle was then lifted to create a pocket for the expander.  The expander was prepared according to the manufacture guidelines.  The air was evacuated and the expander was inflated with 70 cc of injectable saline.  The expander was placed under the pectoralis muscle and secured in place with 2-0 PDS.  The  FlexHD was then prepared according to the guidelines and placed at the border of the pectoralis muscle and secured with 2-0 PDS and then to the inframammary fold.  The drain was placed and secured with a 3-0 silk.  The skin was closed with 3-0 Monocryl followed by 4-0 Monocryl and 5-0 Monocryl.  Dermabond, ABDs and a breast binder was applied.  The patient was allowed to wake up and taken to  recovery room in stable condition at the end of the case. The family was notified at the end of the case as well.

## 2013-04-15 NOTE — Anesthesia Procedure Notes (Signed)
Procedure Name: Intubation Date/Time: 04/15/2013 12:05 PM Performed by: Rogelia Boga Pre-anesthesia Checklist: Patient identified, Emergency Drugs available, Suction available, Patient being monitored and Timeout performed Patient Re-evaluated:Patient Re-evaluated prior to inductionOxygen Delivery Method: Circle system utilized Preoxygenation: Pre-oxygenation with 100% oxygen Intubation Type: IV induction Ventilation: Mask ventilation without difficulty Laryngoscope Size: Mac and 4 Grade View: Grade II Tube type: Oral Tube size: 7.0 mm Number of attempts: 1 Airway Equipment and Method: Stylet Secured at: 20 cm Tube secured with: Tape Dental Injury: Teeth and Oropharynx as per pre-operative assessment

## 2013-04-15 NOTE — Preoperative (Signed)
Beta Blockers   Reason not to administer Beta Blockers:Not Applicable 

## 2013-04-15 NOTE — Transfer of Care (Signed)
Immediate Anesthesia Transfer of Care Note  Patient: Bethany Michael  Procedure(s) Performed: Procedure(s): LEFT LATISSIMUS MUSCLE FLAP WITH PLACEMENT OF TISSUE EXPANDER (Left) PLACEMENT OF TISSUE EXPANDER AND FLEX HD (ACELLULAR HYDRATED DERMIS) ON THE RIGHT (Right)  Patient Location: PACU  Anesthesia Type:General  Level of Consciousness: awake, alert , oriented and patient cooperative  Airway & Oxygen Therapy: Patient Spontanous Breathing and Patient connected to nasal cannula oxygen  Post-op Assessment: Report given to PACU RN, Post -op Vital signs reviewed and stable and Patient moving all extremities X 4  Post vital signs: Reviewed and stable  Complications: No apparent anesthesia complications

## 2013-04-15 NOTE — Interval H&P Note (Signed)
History and Physical Interval Note:  04/15/2013 10:12 AM  Bethany Michael  has presented today for surgery, with the diagnosis of History of Breast Cancer  The various methods of treatment have been discussed with the patient and family. After consideration of risks, benefits and other options for treatment, the patient has consented to  Procedure(s): LEFT LATISSIMUS MUSCLE FLAP WITH PLACEMENT OF TISSUE EXPANDER (Left) PLACEMENT OF TISSUE EXPANDER AND FLEX HD (ACELLULAR HYDRATED DERMIS) ON THE RIGHT (Right) as a surgical intervention .  The patient's history has been reviewed, patient examined, no change in status, stable for surgery.  I have reviewed the patient's chart and labs.  Questions were answered to the patient's satisfaction.     SANGER,Teigen Parslow

## 2013-04-15 NOTE — H&P (Signed)
This document contains confidential information from a [facility] medical record system and may be unauthenticated. Release may be made only with a valid authorization or in accordance with applicable policies of Medical Center or its affiliates. This document must be maintained in a secure manner or discarded/destroyed as required by Medical Center policy or by a confidential means such as shredding.     Bethany Michael  03/26/2013 2:15 PM   Office Visit  MRN:  [id number]  Department:  Plastic Surgery  Dept Phone: [phone]  Description: Female DOB: December 13, 1960  Provider: Wayland Denis, DO    Diagnoses    Breast cancer, left (HCC)    -  Primary  174.9     Reason for Visit -  Breast Reconstruction     Vitals - Last Recorded    101/65  81  98.3 F (36.8 C)  1.702 m (5\' 7" )  68.493 kg (151 lb)  23.64 kg/m2     Subjective:   Patient ID: Bethany Michael is a 52 y.o. female.  HPI The patient is a 52 yrs old female here for a history and physical for bilateral breast reconstruction. She was diagnosed with advanced left breast cancer, ER/PR negative, Ki-67 are her 50% HER-2/neu not amplified. MRI revealed a 4.9 x 4.9 x 3.9 cm mass seen at the pectoralis fascia with skin thickening but no obvious axillary nodes. She also had atypical cells and a small nodule in the right breast in the retroareolar area and wants to proceed with bilateral mastectomies. She was treated with FEC x6 cycles and has had 3 cycles of Taxotere. She is seeing Dr. Abbey Chatters for the mastectomies. There may be a 1 1/2 cm nodule in the superior portion of the left breast at the 12:00 position. The right breast is negative for any masses. The right-sided Port-A-Cath is intact. She initially wanted to delay surgery till after Christmas but this is discouraged by all involved. There is a right thyroid nodule being worked up by Dr. Abbey Chatters. Her radiation to the left breast ended March 2014. She has a c-section scar that  has well healed. She is 5 feet 7 inches tall, weighs 151 pounds and wore a 34B bra.  The following portions of the patient's history were reviewed and updated as appropriate: allergies, current medications, past family history, past medical history, past social history, past surgical history and problem list.  Review of Systems  Constitutional: Negative.   HENT: Negative.   Eyes: Negative.   Respiratory: Negative.   Cardiovascular: Negative.   Gastrointestinal: Negative.   Endocrine: Negative.   Genitourinary: Negative.   Neurological: Negative.   Hematological: Negative.   Psychiatric/Behavioral: Negative.    Objective:    Physical Exam  Constitutional: She appears well-developed and well-nourished.  HENT:   Head: Normocephalic and atraumatic.  Eyes: Conjunctivae and EOM are normal. Pupils are equal, round, and reactive to light.  Neck: Normal range of motion.  Cardiovascular: Normal rate.   Pulmonary/Chest: Effort normal.  Abdominal: Soft.  Musculoskeletal: Normal range of motion.  Neurological: She is alert.  Skin: Skin is warm.  Psychiatric: She has a normal mood and affect. Her behavior is normal. Judgment and thought content normal.     Assessment:     Breast cancer, left (HCC)       Plan:    We are planning on doing a left latissimus/expander reconstruction on the left and expander/Flex HD on the right.  The consent was obtained with risks and complications  reviewed which included bleeding, pain, scar, infection and the risk of anesthesia.  The patients questions were answered to the patients expressed satisfaction.  Medications Ordered This Encounter    cephalexin (KEFLEX) 500 MG capsule 28 capsule 0 03/26/2013      Take 1 capsule (500 mg total) by mouth 4 times daily. - Oral    diazepam (VALIUM) 2 MG tablet 20 tablet 0 03/26/2013 04/05/2013    Take 1 tablet (2 mg total) by mouth every 6 (six) hours as needed for up to 10 days for Anxiety. - Oral    ondansetron  (ZOFRAN, AS HYDROCHLORIDE,) 4 MG tablet 10 tablet 0 03/26/2013 04/02/2013    Take 1 tablet (4 mg total) by mouth every 8 (eight) hours as needed for up to 7 days for Nausea / Vomiting. - Oral    HYDROcodone-acetaminophen (NORCO) 5-325 mg per tablet 30 tablet 0 03/26/2013 04/05/2013    Take 1 tablet by mouth every 6 (six) hours as needed for up to 10 days for Pain. - Oral

## 2013-04-15 NOTE — Anesthesia Postprocedure Evaluation (Signed)
Anesthesia Post Note  Patient: Bethany Michael  Procedure(s) Performed: Procedure(s) (LRB): LEFT LATISSIMUS MUSCLE FLAP WITH PLACEMENT OF TISSUE EXPANDER (Left) PLACEMENT OF TISSUE EXPANDER AND FLEX HD (ACELLULAR HYDRATED DERMIS) ON THE RIGHT (Right)  Anesthesia type: General  Patient location: PACU  Post pain: Pain level controlled and Adequate analgesia  Post assessment: Post-op Vital signs reviewed, Patient's Cardiovascular Status Stable, Respiratory Function Stable, Patent Airway and Pain level controlled  Last Vitals:  Filed Vitals:   04/15/13 1630  BP: 124/65  Pulse: 89  Temp:   Resp: 16    Post vital signs: Reviewed and stable  Level of consciousness: awake, alert  and oriented  Complications: No apparent anesthesia complications

## 2013-04-15 NOTE — H&P (View-Only) (Signed)
This document contains confidential information from a [facility] medical record system and may be unauthenticated. Release may be made only with a valid authorization or in accordance with applicable policies of Medical Center or its affiliates. This document must be maintained in a secure manner or discarded/destroyed as required by Medical Center policy or by a confidential means such as shredding.     Bethany Michael  03/26/2013 2:15 PM   Office Visit  MRN:  [id number]  Department:  Plastic Surgery  Dept Phone: [phone]  Description: Female DOB: 12/13/1960  Provider: [name], DO    Diagnoses    Breast cancer, left (HCC)    -  Primary  174.9     Reason for Visit -  Breast Reconstruction     Vitals - Last Recorded    101/65  81  98.3 F (36.8 C)  1.702 m (5' 7")  68.493 kg (151 lb)  23.64 kg/m2     Subjective:   Patient ID: Bethany Michael is a 52 y.o. female.  HPI The patient is a 52 yrs old female here for a history and physical for bilateral breast reconstruction. She was diagnosed with advanced left breast cancer, ER/PR negative, Ki-67 are her 50% HER-2/neu not amplified. MRI revealed a 4.9 x 4.9 x 3.9 cm mass seen at the pectoralis fascia with skin thickening but no obvious axillary nodes. She also had atypical cells and a small nodule in the right breast in the retroareolar area and wants to proceed with bilateral mastectomies. She was treated with FEC x6 cycles and has had 3 cycles of Taxotere. She is seeing Dr. Rosenbower for the mastectomies. There may be a 1 1/2 cm nodule in the superior portion of the left breast at the 12:00 position. The right breast is negative for any masses. The right-sided Port-A-Cath is intact. She initially wanted to delay surgery till after Christmas but this is discouraged by all involved. There is a right thyroid nodule being worked up by Dr. Rosenbower. Her radiation to the left breast ended March 2014. She has a c-section scar that  has well healed. She is 5 feet 7 inches tall, weighs 151 pounds and wore a 34B bra.  The following portions of the patient's history were reviewed and updated as appropriate: allergies, current medications, past family history, past medical history, past social history, past surgical history and problem list.  Review of Systems  Constitutional: Negative.   HENT: Negative.   Eyes: Negative.   Respiratory: Negative.   Cardiovascular: Negative.   Gastrointestinal: Negative.   Endocrine: Negative.   Genitourinary: Negative.   Neurological: Negative.   Hematological: Negative.   Psychiatric/Behavioral: Negative.    Objective:    Physical Exam  Constitutional: She appears well-developed and well-nourished.  HENT:   Head: Normocephalic and atraumatic.  Eyes: Conjunctivae and EOM are normal. Pupils are equal, round, and reactive to light.  Neck: Normal range of motion.  Cardiovascular: Normal rate.   Pulmonary/Chest: Effort normal.  Abdominal: Soft.  Musculoskeletal: Normal range of motion.  Neurological: She is alert.  Skin: Skin is warm.  Psychiatric: She has a normal mood and affect. Her behavior is normal. Judgment and thought content normal.     Assessment:     Breast cancer, left (HCC)       Plan:    We are planning on doing a left latissimus/expander reconstruction on the left and expander/Flex HD on the right.  The consent was obtained with risks and complications   reviewed which included bleeding, pain, scar, infection and the risk of anesthesia.  The patients questions were answered to the patients expressed satisfaction.  Medications Ordered This Encounter    cephalexin (KEFLEX) 500 MG capsule 28 capsule 0 03/26/2013      Take 1 capsule (500 mg total) by mouth 4 times daily. - Oral    diazepam (VALIUM) 2 MG tablet 20 tablet 0 03/26/2013 04/05/2013    Take 1 tablet (2 mg total) by mouth every 6 (six) hours as needed for up to 10 days for Anxiety. - Oral    ondansetron  (ZOFRAN, AS HYDROCHLORIDE,) 4 MG tablet 10 tablet 0 03/26/2013 04/02/2013    Take 1 tablet (4 mg total) by mouth every 8 (eight) hours as needed for up to 7 days for Nausea / Vomiting. - Oral    HYDROcodone-acetaminophen (NORCO) 5-325 mg per tablet 30 tablet 0 03/26/2013 04/05/2013    Take 1 tablet by mouth every 6 (six) hours as needed for up to 10 days for Pain. - Oral     

## 2013-04-15 NOTE — Brief Op Note (Signed)
04/15/2013  3:55 PM  PATIENT:  Bethany Michael  52 y.o. female  PRE-OPERATIVE DIAGNOSIS:  History of Breast Cancer  POST-OPERATIVE DIAGNOSIS:  History of Breast Cancer  PROCEDURE:  Procedure(s): LEFT LATISSIMUS MUSCLE FLAP WITH PLACEMENT OF TISSUE EXPANDER (Left) PLACEMENT OF TISSUE EXPANDER AND FLEX HD (ACELLULAR HYDRATED DERMIS) ON THE RIGHT (Right)  SURGEON:  Surgeon(s) and Role:    * Alyzza Andringa Sanger, DO - Primary  PHYSICIAN ASSISTANT: Shawn Rayburn, PA  ASSISTANTS: none   ANESTHESIA:   general  EBL:  Total I/O In: 2250 [I.V.:2000; IV Piggyback:250] Out: 400 [Urine:350; Blood:50]  BLOOD ADMINISTERED:none  DRAINS: (4) Jackson-Pratt drain(s) with closed bulb suction in the back (2) and each breast pocket (1 on each side)   LOCAL MEDICATIONS USED:  MARCAINE     SPECIMEN:  Source of Specimen:  left mastectomy scar  DISPOSITION OF SPECIMEN:  PATHOLOGY  COUNTS:  YES  TOURNIQUET:  * No tourniquets in log *  DICTATION: .Dragon Dictation  PLAN OF CARE: Admit to inpatient   PATIENT DISPOSITION:  PACU - hemodynamically stable.   Delay start of Pharmacological VTE agent (>24hrs) due to surgical blood loss or risk of bleeding: no

## 2013-04-16 ENCOUNTER — Encounter (HOSPITAL_COMMUNITY): Payer: Self-pay | Admitting: General Practice

## 2013-04-16 MED ORDER — MORPHINE SULFATE 2 MG/ML IJ SOLN: 2.0000 mg | INTRAMUSCULAR | Status: DC | PRN

## 2013-04-16 MED ORDER — HYDROCODONE-ACETAMINOPHEN 5-325 MG PO TABS
1.0000 | ORAL_TABLET | Freq: Four times a day (QID) | ORAL | Status: DC | PRN
  Administered 2013-04-16 (×2): 1 via ORAL
  Filled 2013-04-16 (×2): qty 1

## 2013-04-16 NOTE — Progress Notes (Signed)
Inpatient Diabetes Program Recommendations  AACE/ADA: New Consensus Statement on Inpatient Glycemic Control (2013)  Target Ranges:  Prepandial:   less than 140 mg/dL      Peak postprandial:   less than 180 mg/dL (1-2 hours)      Critically ill patients:  140 - 180 mg/dL   Reason for Assessment: Elevated lab glucose  Results for Bethany Michael, Bethany Michael (MRN [id number]) as of 04/16/2013 10:06  Ref. Range 04/15/2013 10:16 04/15/2013 20:05  Glucose Latest Range: 70-99 mg/dL 94 409 (H)   Note:  Patient has no history of diabetes.  Has IV of D5 1/2 NS infusing at 125/hr.  Note patient is for possible discharge today.  If patient is not discharged, please consider ordering lab glucose in AM and/or CBG's ac and hs, and Hbg A1C with recommendation for follow-up with PCP if results abnormal.  Thank you.  Grabiel Schmutz S. Elsie Lincoln, RN, CNS, CDE Inpatient Diabetes Program, team pager [phone]

## 2013-04-16 NOTE — Progress Notes (Signed)
1 Day Post-Op  Subjective: Doing well in room, up and voiding without difficulty, pain controlled and ready to eat something.  Objective: Vital signs in last 24 hours: Temp:  [97.2 F (36.2 C)-98.7 F (37.1 C)] 98.7 F (37.1 C) (11/18 0545) Pulse Rate:  [60-96] 96 (11/18 0545) Resp:  [11-23] 23 (11/18 0545) BP: (110-142)/(52-101) 110/66 mmHg (11/18 0545) SpO2:  [95 %-100 %] 98 % (11/18 0545) FiO2 (%):  [41 %] 41 % (11/17 2000) Weight:  [70.081 kg (154 lb 8 oz)] 70.081 kg (154 lb 8 oz) (11/17 1748)    Intake/Output from previous day: 11/17 0701 - 11/18 0700 In: 4050.4 [I.V.:3785.4; IV Piggyback:250] Out: 921 [Urine:350; Drains:471; Blood:100] Intake/Output this shift:    General appearance: alert, cooperative and no distress Incision/Wound:flap warm and with excellent capillary refill.  Lab Results:   Recent Labs  04/15/13 2005  WBC 11.0*  HGB 12.1  HCT 34.4*  PLT 162   BMET  Recent Labs  04/15/13 1016 04/15/13 2005  NA 139 135  K 4.4 3.7  CL 103 101  CO2 26 20  GLUCOSE 94 227*  BUN 20 13  CREATININE 0.79 0.63  CALCIUM 9.9 8.9   PT/INR No results found for this basename: LABPROT, INR,  in the last 72 hours ABG No results found for this basename: PHART, PCO2, PO2, HCO3,  in the last 72 hours  Studies/Results: No results found.  Anti-infectives: Anti-infectives   Start     Dose/Rate Route Frequency Ordered Stop   04/15/13 1800  Ampicillin-Sulbactam (UNASYN) 3 g in sodium chloride 0.9 % 100 mL IVPB     3 g 100 mL/hr over 60 Minutes Intravenous Every 6 hours 04/15/13 1754     04/15/13 1240  polymyxin B 500,000 Units, bacitracin 50,000 Units in sodium chloride irrigation 0.9 % 500 mL irrigation  Status:  Discontinued       As needed 04/15/13 1241 04/15/13 1606   04/15/13 1015  ceFAZolin (ANCEF) IVPB 2 g/50 mL premix     2 g 100 mL/hr over 30 Minutes Intravenous On call to O.R. 04/15/13 1009 04/15/13 1210   04/14/13 1106  ceFAZolin (ANCEF) IVPB 2 g/50  mL premix  Status:  Discontinued     2 g 100 mL/hr over 30 Minutes Intravenous On call to O.R. 04/14/13 1106 04/15/13 1754      Assessment/Plan: s/p Procedure(s): LEFT LATISSIMUS MUSCLE FLAP WITH PLACEMENT OF TISSUE EXPANDER (Left) PLACEMENT OF TISSUE EXPANDER AND FLEX HD (ACELLULAR HYDRATED DERMIS) ON THE RIGHT (Right) Possible D/C today, d/c PCA and initiate regular diet.  LOS: 1 day    [facility] 04/16/2013

## 2013-04-17 ENCOUNTER — Encounter (HOSPITAL_COMMUNITY): Payer: Self-pay | Admitting: Plastic Surgery

## 2013-04-17 NOTE — Discharge Summary (Signed)
Physician Discharge Summary  Patient ID: ANNALYSE LANGLAIS MRN: [id number] DOB/AGE: 12/10/1960 52 y.o.  Admit date: 04/15/2013 Discharge date: 04/17/2013  Admission Diagnoses:  Discharge Diagnoses:  Active Problems:   * No active hospital problems. *   Discharged Condition: good  Hospital Course: The patient was taken to the OR and underwent a latissimus flap with expander placement on the left with an expander and FlexHD placed on the right.  She has done very well post operatively.  She had mild nausea but was relieved with meds and regular diet.  She was ambulating, voiding and pain was well controlled.  She was eager to go home.  Consults: None  Significant Diagnostic Studies: labs: CBC and BMP  Treatments: surgery: bilateral breast reconstruction  Discharge Exam: Blood pressure 97/60, pulse 87, temperature 97.6 F (36.4 C), temperature source Oral, resp. rate 17, height 5\' 7"  (1.702 m), weight 70.081 kg (154 lb 8 oz), SpO2 98.00%. General appearance: alert, cooperative and no distress Incision/Wound:incisions intact and flap with good color.  Disposition: 01-Home or Self Care   Future Appointments Provider Department Dept Phone   05/20/2013 9:00 AM [facility] [phone]   05/21/2013 9:30 AM [facility] Covering Provider [facility] [phone]       Medication List    Notice   You have not been prescribed any medications.         Follow-up Information   Follow up with [facility], DO In 1 week.   Specialty:  Plastic Surgery   Contact information:   [address] [place] [address] [phone]       Signed: Wayland Denis 04/17/2013, 7:29 AM

## 2013-04-17 NOTE — Progress Notes (Signed)
Discharge instructions reviewed with pt and pt states she already has prescriptions.  Pt verbalized understanding and had no questions.  Pt also states she knows how to empty and record JP drainage and had no questions.  Pt discharged in stable condition via wheelchair with husband.  Hector Shade [place]

## 2013-05-20 ENCOUNTER — Other Ambulatory Visit (HOSPITAL_COMMUNITY): Payer: Self-pay

## 2013-05-21 ENCOUNTER — Ambulatory Visit (HOSPITAL_COMMUNITY): Payer: Self-pay

## 2013-05-22 ENCOUNTER — Encounter (HOSPITAL_COMMUNITY): Payer: Self-pay

## 2013-09-18 ENCOUNTER — Other Ambulatory Visit: Payer: Self-pay | Admitting: Plastic Surgery

## 2013-09-18 DIAGNOSIS — Z9013 Acquired absence of bilateral breasts and nipples: Secondary | ICD-10-CM

## 2013-09-18 NOTE — H&P (Signed)
Bethany Michael is an 53 y.o. female.   Chief Complaint: Acquired absence of breasts HPI: The patient is a 53 yrs old female here for a history and physical for exchange surgery with placement of bilateral silicone implants after bilateral breast reconstruction with expander and ADM placement on the right and latissimus flap and expander placement to the left breast following bilateral mastectomies (03/2013).  History:  She was diagnosed with advanced left breast cancer, ER/PR negative, Ki-67 are her 50% HER-2/neu not amplified. MRI revealed a 4.9 x 4.9 x 3.9 cm mass seen at the pectoralis fascia with skin thickening. She also had atypical cells and a small nodule in the right breast in the retroareolar area. She was treated with FEC x6 cycles and has had 3 cycles of Taxotere. Mastectomies were done by Dr. Zella Richer. Her radiation to the left breast ended March 2014. She has a c-section scar that has well healed. She is 5 feet 7 inches tall, weighs 151 pounds and wore a 34B bra. She is interested in going ahead with the second procedure. There is some tightening of the capsule noted by the location of the expanders and tightness of the tissue. This will require capsulectomies to release the scar tissue at the time of implant placement. Due to the loss of soft tissue in the upper poles and asymmetry of the breasts, she will require lipofilling in order to obtain symmetry and decrease the risk of rippling. The fat will be obtained from the abdominal area and grafted into the breast upper pole.  Current volumes:  Right: 30 cc for a total of 320/350 cc, Left: 30 cc for a total of 340/350 cc  The following portions of the patient's history were reviewed and updated as appropriate: allergies, current medications, past family history, past medical history, past social history, past surgical history and problem list.   Past Medical History  Diagnosis Date  . Cancer     breast  . Family history of anesthesia  complication     Father "affected cognition"  . Hyperthyroidism   . Kidney stone     presently  . Cancer of left breast 10/03/11    ER/PR -, Her 2 -  . Status post chemotherapy      NEOADJUVANT:6 CYCLES FEC / 4 CYCLES TAXOTERE  . Port-a-cath in place 10/2011  . S/P bilateral mastectomy 05/03/12    right-benign, left inv ductal, DCIS  . Port catheter in place 07/24/2012  . S/P radiation therapy 2/1/0/14 - 08/23/12    Left Chest Wall / 50 Gy / 25 Fractions; Left supraclavicular  46 Gy / 23 Fractions; Left posterior Axillary Boost / 2.898 Gy / 23 fractions; Left Chest Wall Scar Boost / 10 Gy / 5 Fractions  . Neuropathy due to chemotherapeutic drug     Hx: of  . PONV (postoperative nausea and vomiting)     Past Surgical History  Procedure Laterality Date  . Cesarean section      3 previous  . Portacath placement  11/17/2011    Procedure: INSERTION PORT-A-CATH;  Surgeon: Odis Hollingshead, MD;  Location: [place];  Service: General;  Laterality: N/A;  ultrasound guided Portacath insertion right side  . Doppler echocardiography      2013  . Mastectomy with axillary lymph node dissection  05/03/2012    Procedure: MASTECTOMY WITH AXILLARY LYMPH NODE DISSECTION;  Surgeon: Odis Hollingshead, MD;  Location: [place];  Service: General;  Laterality: Left;  bilateral mastectomies with  left axillary sentinel lymph node dissection  . Total mastectomy  05/03/2012    Procedure: TOTAL MASTECTOMY;  Surgeon: Odis Hollingshead, MD;  Location: [place];  Service: General;  Laterality: Bilateral;  bilateral mastectomies with left axillary sentinel lymph node dissection  . Port-a-cath removal N/A 10/05/2012    Procedure: REMOVAL PORT-A-CATH;  Surgeon: Odis Hollingshead, MD;  Location: [place];  Service: General;  Laterality: N/A;  . Biopsy thyroid  11/2012  . Latissimus flap to breast Left 04/15/2013    WITH TISSUE EXPANDER BILATERAL    DR Migdalia Dk  . Latissimus flap to breast Left 04/15/2013    Procedure:  LEFT LATISSIMUS MUSCLE FLAP WITH PLACEMENT OF TISSUE EXPANDER;  Surgeon: Theodoro Kos, DO;  Location: [place];  Service: Plastics;  Laterality: Left;  . Breast reconstruction with placement of tissue expander and flex hd (acellular hydrated dermis) Right 04/15/2013    Procedure: PLACEMENT OF TISSUE EXPANDER AND FLEX HD (ACELLULAR HYDRATED DERMIS) ON THE RIGHT;  Surgeon: Theodoro Kos, DO;  Location: [place];  Service: Plastics;  Laterality: Right;    Family History  Problem Relation Age of Onset  . Diabetes Mother   . Diabetes Maternal Grandmother    Social History:  reports that she has never smoked. She has never used smokeless tobacco. She reports that she drinks alcohol. She reports that she does not use illicit drugs.  Allergies: No Known Allergies   (Not in a hospital admission)  No results found for this or any previous visit (from the past 48 hour(s)). No results found.  Review of Systems  Constitutional: Negative.   HENT: Negative.   Eyes: Negative.   Respiratory: Negative.   Cardiovascular: Negative.   Gastrointestinal: Negative.   Genitourinary: Negative.   Musculoskeletal: Negative.   Skin: Negative.   Neurological: Negative.   Psychiatric/Behavioral: Negative.     There were no vitals taken for this visit. Physical Exam  Constitutional: She is oriented to person, place, and time. She appears well-developed and well-nourished.  HENT:  Head: Normocephalic and atraumatic.  Eyes: Conjunctivae are normal. Pupils are equal, round, and reactive to light.  Cardiovascular: Normal rate.   Respiratory: Effort normal.  GI: Soft.  Neurological: She is alert and oriented to person, place, and time.  Skin: Skin is warm.  Psychiatric: She has a normal mood and affect. Her behavior is normal. Judgment and thought content normal.     Assessment/Plan Plan for bilateral breast reconstruction with removal of expanders, capsulectomies and placement of implants with possible  lipofilling.  Jolynda Townley Sanger 09/18/2013, 12:28 PM

## 2013-09-19 ENCOUNTER — Encounter (HOSPITAL_BASED_OUTPATIENT_CLINIC_OR_DEPARTMENT_OTHER): Payer: Self-pay | Admitting: *Deleted

## 2013-09-19 NOTE — Progress Notes (Signed)
No labs needed

## 2013-09-25 ENCOUNTER — Encounter (HOSPITAL_BASED_OUTPATIENT_CLINIC_OR_DEPARTMENT_OTHER)
Admission: RE | Disposition: A | Discharge status: Home / Self Care | Payer: Self-pay | Source: Ambulatory Visit | Attending: Plastic Surgery

## 2013-09-25 ENCOUNTER — Ambulatory Visit (HOSPITAL_BASED_OUTPATIENT_CLINIC_OR_DEPARTMENT_OTHER)
Admission: RE | Admit: 2013-09-25 | Discharge: 2013-09-25 | Disposition: A | Discharge status: Home / Self Care | Payer: Medicaid Other | Source: Ambulatory Visit | Attending: Plastic Surgery | Admitting: Plastic Surgery

## 2013-09-25 ENCOUNTER — Encounter (HOSPITAL_BASED_OUTPATIENT_CLINIC_OR_DEPARTMENT_OTHER): Payer: Self-pay | Admitting: *Deleted

## 2013-09-25 ENCOUNTER — Encounter (HOSPITAL_BASED_OUTPATIENT_CLINIC_OR_DEPARTMENT_OTHER): Payer: Medicaid Other | Admitting: Anesthesiology

## 2013-09-25 ENCOUNTER — Ambulatory Visit (HOSPITAL_BASED_OUTPATIENT_CLINIC_OR_DEPARTMENT_OTHER): Payer: Medicaid Other | Admitting: Anesthesiology

## 2013-09-25 DIAGNOSIS — Z853 Personal history of malignant neoplasm of breast: Secondary | ICD-10-CM | POA: Insufficient documentation

## 2013-09-25 DIAGNOSIS — Z9221 Personal history of antineoplastic chemotherapy: Secondary | ICD-10-CM | POA: Insufficient documentation

## 2013-09-25 DIAGNOSIS — Z9010 Acquired absence of unspecified breast and nipple: Secondary | ICD-10-CM | POA: Insufficient documentation

## 2013-09-25 DIAGNOSIS — Z923 Personal history of irradiation: Secondary | ICD-10-CM | POA: Insufficient documentation

## 2013-09-25 DIAGNOSIS — E0590 Thyrotoxicosis, unspecified without thyrotoxic crisis or storm: Secondary | ICD-10-CM | POA: Insufficient documentation

## 2013-09-25 DIAGNOSIS — N6489 Other specified disorders of breast: Secondary | ICD-10-CM | POA: Insufficient documentation

## 2013-09-25 DIAGNOSIS — Z9013 Acquired absence of bilateral breasts and nipples: Secondary | ICD-10-CM

## 2013-09-25 DIAGNOSIS — Z421 Encounter for breast reconstruction following mastectomy: Secondary | ICD-10-CM | POA: Insufficient documentation

## 2013-09-25 HISTORY — PX: REMOVAL OF BILATERAL TISSUE EXPANDERS WITH PLACEMENT OF BILATERAL BREAST IMPLANTS: SHX6431

## 2013-09-25 LAB — POCT HEMOGLOBIN-HEMACUE: Hemoglobin: 14 g/dL (ref 12.0–15.0)

## 2013-09-25 SURGERY — REMOVAL, TISSUE EXPANDER, BREAST, BILATERAL, WITH BILATERAL IMPLANT IMPLANT INSERTION
Anesthesia: General | Site: Chest | Laterality: Bilateral

## 2013-09-25 MED ORDER — LIDOCAINE HCL (CARDIAC) 20 MG/ML IV SOLN
INTRAVENOUS | Status: DC | PRN
  Administered 2013-09-25: 50 mg via INTRAVENOUS

## 2013-09-25 MED ORDER — LACTATED RINGERS IV SOLN
INTRAVENOUS | Status: DC | PRN
  Administered 2013-09-25: 1000 mL via INTRAVENOUS

## 2013-09-25 MED ORDER — CEFAZOLIN SODIUM-DEXTROSE 2-3 GM-% IV SOLR
2.0000 g | INTRAVENOUS | Status: AC
  Administered 2013-09-25: 2 g via INTRAVENOUS

## 2013-09-25 MED ORDER — EPINEPHRINE HCL 1 MG/ML IJ SOLN
INTRAMUSCULAR | Status: AC
  Filled 2013-09-25: qty 1

## 2013-09-25 MED ORDER — LACTATED RINGERS IV SOLN
INTRAVENOUS | Status: DC
  Administered 2013-09-25 (×4): via INTRAVENOUS

## 2013-09-25 MED ORDER — LIDOCAINE-EPINEPHRINE 1 %-1:100000 IJ SOLN
INTRAMUSCULAR | Status: DC | PRN
  Administered 2013-09-25: 10 mL

## 2013-09-25 MED ORDER — OXYCODONE HCL 5 MG/5ML PO SOLN: 5.0000 mg | Freq: Once | ORAL | Status: AC | PRN

## 2013-09-25 MED ORDER — EPINEPHRINE HCL 1 MG/ML IJ SOLN
INTRAMUSCULAR | Status: DC | PRN
  Administered 2013-09-25: 1 mg

## 2013-09-25 MED ORDER — FENTANYL CITRATE 0.05 MG/ML IJ SOLN: 50.0000 ug | INTRAMUSCULAR | Status: DC | PRN

## 2013-09-25 MED ORDER — PHENYLEPHRINE HCL 10 MG/ML IJ SOLN
INTRAMUSCULAR | Status: DC | PRN
  Administered 2013-09-25: 80 ug via INTRAVENOUS

## 2013-09-25 MED ORDER — LIDOCAINE HCL (PF) 1 % IJ SOLN
INTRAMUSCULAR | Status: AC
  Filled 2013-09-25: qty 60

## 2013-09-25 MED ORDER — EPHEDRINE SULFATE 50 MG/ML IJ SOLN
INTRAMUSCULAR | Status: DC | PRN
  Administered 2013-09-25 (×3): 10 mg via INTRAVENOUS

## 2013-09-25 MED ORDER — HYDROMORPHONE HCL PF 1 MG/ML IJ SOLN
INTRAMUSCULAR | Status: AC
  Filled 2013-09-25: qty 1

## 2013-09-25 MED ORDER — PROPOFOL 10 MG/ML IV BOLUS
INTRAVENOUS | Status: DC | PRN
  Administered 2013-09-25: 100 mg via INTRAVENOUS
  Administered 2013-09-25: 50 mg via INTRAVENOUS
  Administered 2013-09-25: 150 mg via INTRAVENOUS

## 2013-09-25 MED ORDER — LIDOCAINE-EPINEPHRINE 1 %-1:100000 IJ SOLN
INTRAMUSCULAR | Status: AC
  Filled 2013-09-25: qty 1

## 2013-09-25 MED ORDER — SODIUM CHLORIDE 0.9 % IR SOLN
Status: DC | PRN
  Administered 2013-09-25: 13:00:00

## 2013-09-25 MED ORDER — OXYCODONE HCL 5 MG PO TABS
ORAL_TABLET | ORAL | Status: AC
  Filled 2013-09-25: qty 1

## 2013-09-25 MED ORDER — FENTANYL CITRATE 0.05 MG/ML IJ SOLN
INTRAMUSCULAR | Status: AC
  Filled 2013-09-25: qty 8

## 2013-09-25 MED ORDER — MIDAZOLAM HCL 2 MG/2ML IJ SOLN: 1.0000 mg | INTRAMUSCULAR | Status: DC | PRN

## 2013-09-25 MED ORDER — SUCCINYLCHOLINE CHLORIDE 20 MG/ML IJ SOLN
INTRAMUSCULAR | Status: DC | PRN
  Administered 2013-09-25: 100 mg via INTRAVENOUS

## 2013-09-25 MED ORDER — HYDROMORPHONE HCL PF 1 MG/ML IJ SOLN
0.2500 mg | INTRAMUSCULAR | Status: DC | PRN
  Administered 2013-09-25 (×2): 0.5 mg via INTRAVENOUS

## 2013-09-25 MED ORDER — MIDAZOLAM HCL 2 MG/2ML IJ SOLN
INTRAMUSCULAR | Status: AC
  Filled 2013-09-25: qty 2

## 2013-09-25 MED ORDER — LIDOCAINE HCL 1 % IJ SOLN
INTRAMUSCULAR | Status: DC | PRN
  Administered 2013-09-25: 50 mL

## 2013-09-25 MED ORDER — OXYCODONE HCL 5 MG PO TABS
5.0000 mg | ORAL_TABLET | Freq: Once | ORAL | Status: AC | PRN
  Administered 2013-09-25: 5 mg via ORAL

## 2013-09-25 MED ORDER — SCOPOLAMINE 1 MG/3DAYS TD PT72
1.0000 | MEDICATED_PATCH | Freq: Once | TRANSDERMAL | Status: DC
  Administered 2013-09-25: 1.5 mg via TRANSDERMAL

## 2013-09-25 MED ORDER — DEXAMETHASONE SODIUM PHOSPHATE 4 MG/ML IJ SOLN
INTRAMUSCULAR | Status: DC | PRN
  Administered 2013-09-25: 10 mg via INTRAVENOUS

## 2013-09-25 MED ORDER — MIDAZOLAM HCL 5 MG/5ML IJ SOLN
INTRAMUSCULAR | Status: DC | PRN
  Administered 2013-09-25: 2 mg via INTRAVENOUS

## 2013-09-25 MED ORDER — CEFAZOLIN SODIUM-DEXTROSE 2-3 GM-% IV SOLR
INTRAVENOUS | Status: AC
  Filled 2013-09-25: qty 50

## 2013-09-25 MED ORDER — FENTANYL CITRATE 0.05 MG/ML IJ SOLN
INTRAMUSCULAR | Status: DC | PRN
  Administered 2013-09-25: 50 ug via INTRAVENOUS
  Administered 2013-09-25: 100 ug via INTRAVENOUS
  Administered 2013-09-25 (×2): 50 ug via INTRAVENOUS

## 2013-09-25 MED ORDER — SCOPOLAMINE 1 MG/3DAYS TD PT72
MEDICATED_PATCH | TRANSDERMAL | Status: AC
  Filled 2013-09-25: qty 1

## 2013-09-25 MED ORDER — ONDANSETRON HCL 4 MG/2ML IJ SOLN
INTRAMUSCULAR | Status: DC | PRN
  Administered 2013-09-25: 4 mg via INTRAVENOUS

## 2013-09-25 SURGICAL SUPPLY — 79 items
BAG DECANTER FOR FLEXI CONT (MISCELLANEOUS) ×6 IMPLANT
BINDER BREAST LRG (GAUZE/BANDAGES/DRESSINGS) ×3 IMPLANT
BINDER BREAST MEDIUM (GAUZE/BANDAGES/DRESSINGS) ×3 IMPLANT
BINDER BREAST XLRG (GAUZE/BANDAGES/DRESSINGS) IMPLANT
BINDER BREAST XXLRG (GAUZE/BANDAGES/DRESSINGS) IMPLANT
BIOPATCH RED 1 DISK 7.0 (GAUZE/BANDAGES/DRESSINGS) IMPLANT
BIOPATCH RED 1IN DISK 7.0MM (GAUZE/BANDAGES/DRESSINGS)
BLADE 15 SAFETY STRL DISP (BLADE) ×3 IMPLANT
BLADE HEX COATED 2.75 (ELECTRODE) ×3 IMPLANT
BLADE SURG 15 STRL LF DISP TIS (BLADE) ×1 IMPLANT
BLADE SURG 15 STRL SS (BLADE) ×2
BNDG GAUZE ELAST 4 BULKY (GAUZE/BANDAGES/DRESSINGS) ×6 IMPLANT
CANISTER LIPO FAT HARVEST (MISCELLANEOUS) ×3 IMPLANT
CANISTER SUCT 1200ML W/VALVE (MISCELLANEOUS) ×6 IMPLANT
CANNULA ASPIRATION (CANNULA) IMPLANT
CHLORAPREP W/TINT 26ML (MISCELLANEOUS) ×6 IMPLANT
CLOSURE WOUND 1/2 X4 (GAUZE/BANDAGES/DRESSINGS) ×1
CORDS BIPOLAR (ELECTRODE) IMPLANT
COVER MAYO STAND STRL (DRAPES) ×6 IMPLANT
COVER TABLE BACK 60X90 (DRAPES) ×3 IMPLANT
DECANTER SPIKE VIAL GLASS SM (MISCELLANEOUS) IMPLANT
DERMABOND ADVANCED (GAUZE/BANDAGES/DRESSINGS) ×4
DERMABOND ADVANCED .7 DNX12 (GAUZE/BANDAGES/DRESSINGS) ×2 IMPLANT
DRAIN CHANNEL 19F RND (DRAIN) IMPLANT
DRAPE LAPAROSCOPIC ABDOMINAL (DRAPES) ×3 IMPLANT
DRSG PAD ABDOMINAL 8X10 ST (GAUZE/BANDAGES/DRESSINGS) ×6 IMPLANT
DRSG TEGADERM 2-3/8X2-3/4 SM (GAUZE/BANDAGES/DRESSINGS) IMPLANT
ELECT BLADE 4.0 EZ CLEAN MEGAD (MISCELLANEOUS) ×3
ELECT REM PT RETURN 9FT ADLT (ELECTROSURGICAL) ×3
ELECTRODE BLDE 4.0 EZ CLN MEGD (MISCELLANEOUS) ×1 IMPLANT
ELECTRODE REM PT RTRN 9FT ADLT (ELECTROSURGICAL) ×1 IMPLANT
EVACUATOR SILICONE 100CC (DRAIN) IMPLANT
FILTER LIPOSUCTION (MISCELLANEOUS) ×3 IMPLANT
GLOVE BIO SURGEON STRL SZ 6.5 (GLOVE) ×16 IMPLANT
GLOVE BIO SURGEONS STRL SZ 6.5 (GLOVE) ×8
GLOVE BIOGEL PI IND STRL 6.5 (GLOVE) ×1 IMPLANT
GLOVE BIOGEL PI INDICATOR 6.5 (GLOVE) ×2
GLOVE ECLIPSE 6.5 STRL STRAW (GLOVE) ×3 IMPLANT
GOWN STRL REUS W/ TWL LRG LVL3 (GOWN DISPOSABLE) ×2 IMPLANT
GOWN STRL REUS W/TWL LRG LVL3 (GOWN DISPOSABLE) ×4
IMPLANT BREAST GEL 345CC (Breast) ×6 IMPLANT
IV NS 1000ML (IV SOLUTION)
IV NS 1000ML BAXH (IV SOLUTION) IMPLANT
IV NS 500ML (IV SOLUTION) ×2
IV NS 500ML BAXH (IV SOLUTION) ×1 IMPLANT
KIT FILL SYSTEM UNIVERSAL (SET/KITS/TRAYS/PACK) ×3 IMPLANT
LINER CANISTER 1000CC FLEX (MISCELLANEOUS) ×3 IMPLANT
NDL SAFETY ECLIPSE 18X1.5 (NEEDLE) ×1 IMPLANT
NEEDLE HYPO 18GX1.5 SHARP (NEEDLE) ×2
NEEDLE HYPO 25X1 1.5 SAFETY (NEEDLE) ×3 IMPLANT
NEEDLE SPNL 18GX3.5 QUINCKE PK (NEEDLE) ×3 IMPLANT
PACK BASIN DAY SURGERY FS (CUSTOM PROCEDURE TRAY) ×3 IMPLANT
PAD ALCOHOL SWAB (MISCELLANEOUS) ×3 IMPLANT
PENCIL BUTTON HOLSTER BLD 10FT (ELECTRODE) ×3 IMPLANT
PIN SAFETY STERILE (MISCELLANEOUS) IMPLANT
SIZER BREAST GENERIC MENTOR (SIZER) ×3 IMPLANT
SIZER BREAST REUSE 345CC (SIZER) ×3 IMPLANT
SLEEVE SCD COMPRESS KNEE MED (MISCELLANEOUS) ×3 IMPLANT
SPONGE GAUZE 4X4 12PLY STER LF (GAUZE/BANDAGES/DRESSINGS) IMPLANT
SPONGE LAP 18X18 X RAY DECT (DISPOSABLE) ×6 IMPLANT
STRIP CLOSURE SKIN 1/2X4 (GAUZE/BANDAGES/DRESSINGS) ×2 IMPLANT
STRIP SUTURE WOUND CLOSURE 1/2 (SUTURE) IMPLANT
SUT MNCRL AB 4-0 PS2 18 (SUTURE) ×6 IMPLANT
SUT MON AB 5-0 PS2 18 (SUTURE) ×6 IMPLANT
SUT PDS AB 2-0 CT2 27 (SUTURE) IMPLANT
SUT VIC AB 3-0 SH 27 (SUTURE) ×8
SUT VIC AB 3-0 SH 27X BRD (SUTURE) ×4 IMPLANT
SUT VICRYL 4-0 PS2 18IN ABS (SUTURE) ×3 IMPLANT
SYR 20CC LL (SYRINGE) IMPLANT
SYR 50ML LL SCALE MARK (SYRINGE) ×6 IMPLANT
SYR BULB IRRIGATION 50ML (SYRINGE) ×3 IMPLANT
SYR CONTROL 10ML LL (SYRINGE) ×3 IMPLANT
SYR TB 1ML LL NO SAFETY (SYRINGE) ×3 IMPLANT
TOWEL OR 17X24 6PK STRL BLUE (TOWEL DISPOSABLE) ×6 IMPLANT
TUBE CONNECTING 20'X1/4 (TUBING) ×1
TUBE CONNECTING 20X1/4 (TUBING) ×2 IMPLANT
TUBING SET GRADUATE ASPIR 12FT (MISCELLANEOUS) ×3 IMPLANT
UNDERPAD 30X30 INCONTINENT (UNDERPADS AND DIAPERS) ×6 IMPLANT
YANKAUER SUCT BULB TIP NO VENT (SUCTIONS) ×3 IMPLANT

## 2013-09-25 NOTE — Op Note (Signed)
Op report Bilateral Exchange   DATE OF OPERATION: 09/25/2013  LOCATION: [facility]  SURGICAL DIVISION: Plastic Surgery  PREOPERATIVE DIAGNOSES:  1. History of breast cancer.  2. Acquired absence of bilateral breast.   POSTOPERATIVE DIAGNOSES:  1. History of breast cancer.  2. Acquired absence of bilateral breast.   PROCEDURE:  1. Bilateral exchange of tissue expanders for implants.  2. Bilateral capsulotomies for implant respositioning.  SURGEON: Theodoro Kos, DO  ASSISTANT: Shawn Rayburn, PA  ANESTHESIA:  General.   COMPLICATIONS: None.   IMPLANTS: Left - [employer]or Smooth Round High Profile Gel 345cc. Ref #[id number].  Serial Number [id number] Right - [employer]or Smooth Round High Profile Gel 345cc. Ref #[id number].  Serial Number [id number]  INDICATIONS FOR PROCEDURE:  The patient, Bethany Michael, is a 53 y.o. female born on 1960/09/26, is here for treatment after bilateral mastectomies.  She had tissue expanders placed with a left latissimus myocutaneous flap due to the left side receiving radiation. She now presents for exchange of her expanders for implants.  She requires capsulotomies to better position the implants.   CONSENT:  Informed consent was obtained directly from the patient. Risks, benefits and alternatives were fully discussed. Specific risks including but not limited to bleeding, infection, hematoma, seroma, scarring, pain, implant infection, implant extrusion, capsular contracture, asymmetry, wound healing problems, and need for further surgery were all discussed. The patient did have an ample opportunity to have her questions answered to her satisfaction.   DESCRIPTION OF PROCEDURE:  The patient was taken to the operating room. SCDs were placed and IV antibiotics were given. The patient's chest was prepped and draped in a sterile fashion. A time out was performed and the implants to be used were identified.  One percent Lidocaine with epinephrine was  used to infiltrate the area.   The old mastectomy scar was opened and superior mastectomy and inferior mastectomy flaps were re-raised over the pectoralis major muscle on the right side. The pectoralis was split to expose the tissue expander which was removed. Inspection of the pocket showed a normal healthy capsule and good integration of the biologic matrix. On the left side, the inferior incision from the latissimus flap was incised and the bovie was used to dissect to the expander.  The expander was removed and the pocket irrigated with antibiotic solution. Circumferential capsulotomies were performed on each breast to allow for breast pocket expansion on either side.  Measurements were made on either side to confirm adequate pocket size for the implant dimensions.  Hemostasis was ensured. A sizer was placed and the decision was made for the implant.  The implant was selected and placed in the pocket.     Gloves were changed. The implants were placed in the pockets and oriented appropriately. The pectoralis major muscle and capsule on the anterior surface were re-closed with a 3-0 Vicryl suture. The remaining skin was closed with 4-0 Monocryl deep dermal and 5-0 Monocryl subcuticular stitches.  The tumescent was placed at the beginning of the case.  The cannula was used to do the liposuction.  The lipofilling cannister was used to prepare the fat.  The fat was then injected through the incision site to place the fat superior and medial (total of 100 cc).  Dermabond was applied. A breast binder and ABDs were placed.  The patient was allowed to wake from anesthesia and taken to the recovery room in satisfactory condition.

## 2013-09-25 NOTE — Interval H&P Note (Signed)
History and Physical Interval Note:  09/25/2013 10:51 AM  Kary Peggye Pitt  has presented today for surgery, with the diagnosis of HX OF BREAST CANCER ABSENT OF BILATERAL BREAST AND NIPPLES   The various methods of treatment have been discussed with the patient and family. After consideration of risks, benefits and other options for treatment, the patient has consented to  Procedure(s): REMOVAL OF BILATERAL TISSUE EXPANDERS WITH PLACEMENT OF BILATERAL BREAST IMPLANTS WITH BILATERAL CAPSULECTOMIES  AND LIPOSUCTION FROM ABDOMEN FOR LIPOFILLING OF BILATERAL BREASTS (Bilateral) LIPOSUCTION WITH LIPOFILLING (Bilateral) as a surgical intervention .  The patient's history has been reviewed, patient examined, no change in status, stable for surgery.  I have reviewed the patient's chart and labs.  Questions were answered to the patient's satisfaction.     Lyndee Leo [employer]

## 2013-09-25 NOTE — Anesthesia Preprocedure Evaluation (Addendum)
Anesthesia Evaluation  Patient identified by MRN, date of birth, ID band Patient awake    Reviewed: Allergy & Precautions, H&P , NPO status , Patient's Chart, lab work & pertinent test results  History of Anesthesia Complications (+) PONVNegative for: history of anesthetic complications  Airway Mallampati: II TM Distance: >3 FB Neck ROM: Full    Dental no notable dental hx. (+) Teeth Intact, Chipped, Dental Advisory Given   Pulmonary neg pulmonary ROS,  breath sounds clear to auscultation  Pulmonary exam normal       Cardiovascular negative cardio ROS  Rhythm:Regular Rate:Normal     Neuro/Psych negative neurological ROS  negative psych ROS   GI/Hepatic negative GI ROS, Neg liver ROS,   Endo/Other  Hyperthyroidism   Renal/GU negative Renal ROS  negative genitourinary   Musculoskeletal   Abdominal   Peds  Hematology negative hematology ROS (+)   Anesthesia Other Findings   Reproductive/Obstetrics negative OB ROS                          Anesthesia Physical Anesthesia Plan  ASA: II  Anesthesia Plan: General   Post-op Pain Management:    Induction: Intravenous  Airway Management Planned: Oral ETT and LMA  Additional Equipment:   Intra-op Plan:   Post-operative Plan: Extubation in OR  Informed Consent: I have reviewed the patients History and Physical, chart, labs and discussed the procedure including the risks, benefits and alternatives for the proposed anesthesia with the patient or authorized representative who has indicated his/her understanding and acceptance.   Dental advisory given  Plan Discussed with: CRNA  Anesthesia Plan Comments:         Anesthesia Quick Evaluation

## 2013-09-25 NOTE — H&P (View-Only) (Signed)
Bethany Michael is an 53 y.o. female.   Chief Complaint: Acquired absence of breasts HPI: The patient is a 53 yrs old female here for a history and physical for exchange surgery with placement of bilateral silicone implants after bilateral breast reconstruction with expander and ADM placement on the right and latissimus flap and expander placement to the left breast following bilateral mastectomies (03/2013).  History:  She was diagnosed with advanced left breast cancer, ER/PR negative, Ki-67 are her 50% HER-2/neu not amplified. MRI revealed a 4.9 x 4.9 x 3.9 cm mass seen at the pectoralis fascia with skin thickening. She also had atypical cells and a small nodule in the right breast in the retroareolar area. She was treated with FEC x6 cycles and has had 3 cycles of Taxotere. Mastectomies were done by Dr. Zella Richer. Her radiation to the left breast ended March 2014. She has a c-section scar that has well healed. She is 5 feet 7 inches tall, weighs 151 pounds and wore a 34B bra. She is interested in going ahead with the second procedure. There is some tightening of the capsule noted by the location of the expanders and tightness of the tissue. This will require capsulectomies to release the scar tissue at the time of implant placement. Due to the loss of soft tissue in the upper poles and asymmetry of the breasts, she will require lipofilling in order to obtain symmetry and decrease the risk of rippling. The fat will be obtained from the abdominal area and grafted into the breast upper pole.  Current volumes:  Right: 30 cc for a total of 320/350 cc, Left: 30 cc for a total of 340/350 cc  The following portions of the patient's history were reviewed and updated as appropriate: allergies, current medications, past family history, past medical history, past social history, past surgical history and problem list.   Past Medical History  Diagnosis Date  . Cancer     breast  . Family history of anesthesia  complication     Father "affected cognition"  . Hyperthyroidism   . Kidney stone     presently  . Cancer of left breast 10/03/11    ER/PR -, Her 2 -  . Status post chemotherapy      NEOADJUVANT:6 CYCLES FEC / 4 CYCLES TAXOTERE  . Port-a-cath in place 10/2011  . S/P bilateral mastectomy 05/03/12    right-benign, left inv ductal, DCIS  . Port catheter in place 07/24/2012  . S/P radiation therapy 2/1/0/14 - 08/23/12    Left Chest Wall / 50 Gy / 25 Fractions; Left supraclavicular  46 Gy / 23 Fractions; Left posterior Axillary Boost / 2.898 Gy / 23 fractions; Left Chest Wall Scar Boost / 10 Gy / 5 Fractions  . Neuropathy due to chemotherapeutic drug     Hx: of  . PONV (postoperative nausea and vomiting)     Past Surgical History  Procedure Laterality Date  . Cesarean section      3 previous  . Portacath placement  11/17/2011    Procedure: INSERTION PORT-A-CATH;  Surgeon: Odis Hollingshead, MD;  Location: [facility] [place];  Service: General;  Laterality: N/A;  ultrasound guided Portacath insertion right side  . Doppler echocardiography      2013  . Mastectomy with axillary lymph node dissection  05/03/2012    Procedure: MASTECTOMY WITH AXILLARY LYMPH NODE DISSECTION;  Surgeon: Odis Hollingshead, MD;  Location: [place];  Service: General;  Laterality: Left;  bilateral mastectomies with  left axillary sentinel lymph node dissection  . Total mastectomy  05/03/2012    Procedure: TOTAL MASTECTOMY;  Surgeon: Odis Hollingshead, MD;  Location: [place];  Service: General;  Laterality: Bilateral;  bilateral mastectomies with left axillary sentinel lymph node dissection  . Port-a-cath removal N/A 10/05/2012    Procedure: REMOVAL PORT-A-CATH;  Surgeon: Odis Hollingshead, MD;  Location: [place];  Service: General;  Laterality: N/A;  . Biopsy thyroid  11/2012  . Latissimus flap to breast Left 04/15/2013    WITH TISSUE EXPANDER BILATERAL    DR Migdalia Dk  . Latissimus flap to breast Left 04/15/2013    Procedure:  LEFT LATISSIMUS MUSCLE FLAP WITH PLACEMENT OF TISSUE EXPANDER;  Surgeon: Theodoro Kos, DO;  Location: [place];  Service: Plastics;  Laterality: Left;  . Breast reconstruction with placement of tissue expander and flex hd (acellular hydrated dermis) Right 04/15/2013    Procedure: PLACEMENT OF TISSUE EXPANDER AND FLEX HD (ACELLULAR HYDRATED DERMIS) ON THE RIGHT;  Surgeon: Theodoro Kos, DO;  Location: [place];  Service: Plastics;  Laterality: Right;    Family History  Problem Relation Age of Onset  . Diabetes Mother   . Diabetes Maternal Grandmother    Social History:  reports that she has never smoked. She has never used smokeless tobacco. She reports that she drinks alcohol. She reports that she does not use illicit drugs.  Allergies: No Known Allergies   (Not in a hospital admission)  No results found for this or any previous visit (from the past 48 hour(s)). No results found.  Review of Systems  Constitutional: Negative.   HENT: Negative.   Eyes: Negative.   Respiratory: Negative.   Cardiovascular: Negative.   Gastrointestinal: Negative.   Genitourinary: Negative.   Musculoskeletal: Negative.   Skin: Negative.   Neurological: Negative.   Psychiatric/Behavioral: Negative.     There were no vitals taken for this visit. Physical Exam  Constitutional: She is oriented to person, place, and time. She appears well-developed and well-nourished.  HENT:  Head: Normocephalic and atraumatic.  Eyes: Conjunctivae are normal. Pupils are equal, round, and reactive to light.  Cardiovascular: Normal rate.   Respiratory: Effort normal.  GI: Soft.  Neurological: She is alert and oriented to person, place, and time.  Skin: Skin is warm.  Psychiatric: She has a normal mood and affect. Her behavior is normal. Judgment and thought content normal.     Assessment/Plan Plan for bilateral breast reconstruction with removal of expanders, capsulectomies and placement of implants with possible  lipofilling.  [name] 09/18/2013, 12:28 PM

## 2013-09-25 NOTE — Discharge Instructions (Signed)
May shower tomorrow Continue binders    Post Anesthesia Home Care Instructions  Activity: Get plenty of rest for the remainder of the day. A responsible adult should stay with you for 24 hours following the procedure.  For the next 24 hours, DO NOT: -Drive a car -Paediatric nurse -Drink alcoholic beverages -Take any medication unless instructed by your physician -Make any legal decisions or sign important papers.  Meals: Start with liquid foods such as gelatin or soup. Progress to regular foods as tolerated. Avoid greasy, spicy, heavy foods. If nausea and/or vomiting occur, drink only clear liquids until the nausea and/or vomiting subsides. Call your physician if vomiting continues.  Special Instructions/Symptoms: Your throat may feel dry or sore from the anesthesia or the breathing tube placed in your throat during surgery. If this causes discomfort, gargle with warm salt water. The discomfort should disappear within 24 hours.   Call your surgeon if you experience:   1.  Fever over 101.0. 2.  Inability to urinate. 3.  Nausea and/or vomiting. 4.  Extreme swelling or bruising at the surgical site. 5.  Continued bleeding from the incision. 6.  Increased pain, redness or drainage from the incision. 7.  Problems related to your pain medication.

## 2013-09-25 NOTE — Anesthesia Procedure Notes (Signed)
Procedure Name: Intubation Date/Time: 09/25/2013 11:22 AM Performed by: Lieutenant Diego Pre-anesthesia Checklist: Patient identified, Emergency Drugs available, Suction available and Patient being monitored Patient Re-evaluated:Patient Re-evaluated prior to inductionOxygen Delivery Method: Circle System Utilized Preoxygenation: Pre-oxygenation with 100% oxygen Intubation Type: IV induction Ventilation: Mask ventilation without difficulty Laryngoscope Size: Miller and 2 Grade View: Grade I Tube type: Oral Tube size: 7.0 mm Number of attempts: 1 Airway Equipment and Method: stylet and oral airway Placement Confirmation: ETT inserted through vocal cords under direct vision,  positive ETCO2 and breath sounds checked- equal and bilateral Secured at: 23 cm Tube secured with: Tape Dental Injury: Teeth and Oropharynx as per pre-operative assessment

## 2013-09-25 NOTE — Transfer of Care (Signed)
Immediate Anesthesia Transfer of Care Note  Patient: Bethany Michael  Procedure(s) Performed: Procedure(s): REMOVAL OF BILATERAL TISSUE EXPANDERS WITH PLACEMENT OF BILATERAL BREAST IMPLANTS WITH BILATERAL CAPSULECTOMIES  AND LIPOSUCTION FROM ABDOMEN FOR LIPOFILLING OF BILATERAL BREASTS (Bilateral)  Patient Location: PACU  Anesthesia Type:General  Level of Consciousness: awake  Airway & Oxygen Therapy: Patient Spontanous Breathing and Patient connected to face mask oxygen  Post-op Assessment: Report given to PACU RN and Post -op Vital signs reviewed and stable  Post vital signs: Reviewed and stable  Complications: No apparent anesthesia complications

## 2013-09-25 NOTE — Brief Op Note (Signed)
09/25/2013  1:31 PM  PATIENT:  Bethany Michael  53 y.o. female  PRE-OPERATIVE DIAGNOSIS:  HX OF BREAST CANCER ABSENT OF BILATERAL BREAST AND NIPPLES   POST-OPERATIVE DIAGNOSIS:  HX OF BREAST CANCER ABSENT OF BILATERAL BREAST AND NIPPLES   PROCEDURE:  Procedure(s): REMOVAL OF BILATERAL TISSUE EXPANDERS WITH PLACEMENT OF BILATERAL BREAST IMPLANTS WITH BILATERAL CAPSULECTOMIES  AND LIPOSUCTION FROM ABDOMEN FOR LIPOFILLING OF BILATERAL BREASTS (Bilateral)  SURGEON:  Surgeon(s) and Role:    * Lawarence Meek Sanger, DO - Primary  PHYSICIAN ASSISTANT: Shawn Rayburn, PA  ASSISTANTS: none   ANESTHESIA:   general  EBL:  Total I/O In: 2000 [I.V.:2000] Out: -   BLOOD ADMINISTERED:none  DRAINS: none   LOCAL MEDICATIONS USED:  LIDOCAINE   SPECIMEN:  No Specimen  DISPOSITION OF SPECIMEN:  N/A  COUNTS:  YES  TOURNIQUET:  * No tourniquets in log *  DICTATION: .Dragon Dictation  PLAN OF CARE: Discharge to home after PACU  PATIENT DISPOSITION:  PACU - hemodynamically stable.   Delay start of Pharmacological VTE agent (>24hrs) due to surgical blood loss or risk of bleeding: no

## 2013-09-25 NOTE — Anesthesia Postprocedure Evaluation (Signed)
  Anesthesia Post-op Note  Patient: Bethany Michael  Procedure(s) Performed: Procedure(s): REMOVAL OF BILATERAL TISSUE EXPANDERS WITH PLACEMENT OF BILATERAL BREAST IMPLANTS WITH BILATERAL CAPSULECTOMIES  AND LIPOSUCTION FROM ABDOMEN FOR LIPOFILLING OF BILATERAL BREASTS (Bilateral)  Patient Location: PACU  Anesthesia Type:General  Level of Consciousness: awake and alert   Airway and Oxygen Therapy: Patient Spontanous Breathing  Post-op Pain: mild  Post-op Assessment: Post-op Vital signs reviewed, Patient's Cardiovascular Status Stable, Respiratory Function Stable, Patent Airway, No signs of Nausea or vomiting and Pain level controlled  Post-op Vital Signs: Reviewed and stable  Last Vitals:  Filed Vitals:   09/25/13 1501  BP: 123/63  Pulse:   Temp: 36.8 C  Resp: 16    Complications: No apparent anesthesia complications

## 2013-09-26 ENCOUNTER — Encounter (HOSPITAL_BASED_OUTPATIENT_CLINIC_OR_DEPARTMENT_OTHER): Payer: Self-pay | Admitting: Plastic Surgery

## 2014-03-26 IMAGING — PT NM PET TUM IMG INITIAL (PI) SKULL BASE T - THIGH
1 of 3 series · 1 of 25 positions shown · non-contrast
Comparison: Breast MR 10/21/2011.

CLINICAL DATA: Initial treatment strategy for breast cancer.

NUCLEAR MEDICINE PET SKULL BASE TO THIGH
Fasting Blood Glucose:  95
TECHNIQUE: 19.5 mCi F-18 FDG was injected intravenously. CT data
was obtained and used for attenuation correction and anatomic
localization only.  (This was not acquired as a diagnostic CT
examination.) Additional exam technical data entered on
technologist worksheet.

[Series 2: ct images · axial · 3.8mm · 0.98mm/px · 1 of 267 slices shown]
[im 267/267  brain]
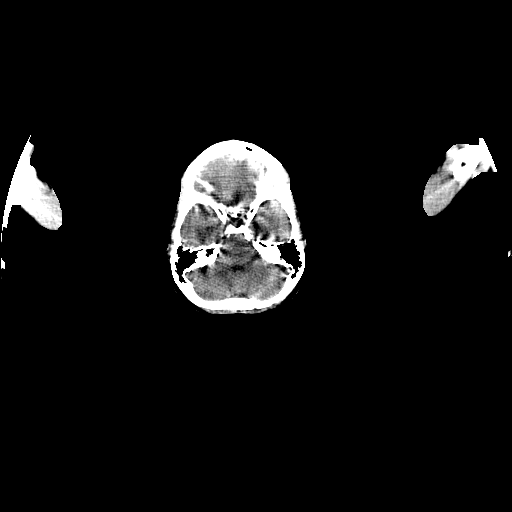

[1 of 25 positions shown; findings below may reference images not displayed]

FINDINGS: Neck: Focal hypermetabolism is seen in the anterior portion of the
right lobe of the thyroid, with an S U V max of 10.9.  There may be
a corresponding low attenuation lesion, measuring approximately 8
mm.  No additional areas of abnormal hypermetabolism in the neck.

CT images show no acute findings.

Chest:  A spiculated mass in the left breast has an S U V max of
14.7.  There is patchy uptake within nodular soft tissue in the
right breast, with an S U V max of 4.3.  No hypermetabolic internal
mammary, mediastinal or hilar lymph nodes.

CT images show no acute findings.  No pericardial or pleural
effusion.

Abdomen/Pelvis:  No abnormal hypermetabolic activity within the
liver, pancreas, adrenal glands, or spleen.  No hypermetabolic
lymph nodes in the abdomen or pelvis.

CT images show no acute findings.  A small stone is seen in the
left kidney.  No obstruction.  No free fluid.

[place]:  No focal hypermetabolic activity to suggest skeletal
metastasis.
IMPRESSION: 1.  Malignant left breast mass without evidence of nodal or distant
metastatic disease.
2.  Nodular uptake in the right breast, worrisome for malignancy.
3.  Focal hypermetabolism in the right lobe of the thyroid, with
suspected underlying low attenuation nodule.  Sonographic
evaluation is recommended, as malignancy cannot be excluded.
4.  Left renal stone.

## 2014-03-31 ENCOUNTER — Encounter (HOSPITAL_BASED_OUTPATIENT_CLINIC_OR_DEPARTMENT_OTHER): Payer: Self-pay | Admitting: Plastic Surgery

## 2016-06-17 ENCOUNTER — Other Ambulatory Visit: Payer: Self-pay | Admitting: Nurse Practitioner

## 2019-07-03 ENCOUNTER — Encounter: Payer: Self-pay | Admitting: Family Medicine
